# Patient Record
Sex: Female | Born: 1970
Health system: Southern US, Community
[De-identification: ages and names within clinical notes are randomized; demographics above are authoritative.]

## PROBLEM LIST (undated history)

## (undated) DIAGNOSIS — E282 Polycystic ovarian syndrome: Secondary | ICD-10-CM

## (undated) DIAGNOSIS — G43909 Migraine, unspecified, not intractable, without status migrainosus: Secondary | ICD-10-CM

## (undated) DIAGNOSIS — N809 Endometriosis, unspecified: Secondary | ICD-10-CM

## (undated) DIAGNOSIS — E209 Hypoparathyroidism, unspecified: Secondary | ICD-10-CM

## (undated) DIAGNOSIS — G2571 Drug induced akathisia: Secondary | ICD-10-CM

## (undated) DIAGNOSIS — E079 Disorder of thyroid, unspecified: Secondary | ICD-10-CM

## (undated) DIAGNOSIS — N200 Calculus of kidney: Secondary | ICD-10-CM

## (undated) DIAGNOSIS — Z91040 Latex allergy status: Secondary | ICD-10-CM

## (undated) DIAGNOSIS — R42 Dizziness and giddiness: Secondary | ICD-10-CM

## (undated) DIAGNOSIS — C801 Malignant (primary) neoplasm, unspecified: Secondary | ICD-10-CM

## (undated) DIAGNOSIS — E039 Hypothyroidism, unspecified: Secondary | ICD-10-CM

## (undated) HISTORY — DX: Hypoparathyroidism, unspecified: E20.9

## (undated) HISTORY — PX: OTHER SURGICAL HISTORY: SHX169

## (undated) HISTORY — PX: THYROIDECTOMY: SHX17

## (undated) HISTORY — DX: Latex allergy status: Z91.040

## (undated) HISTORY — DX: Dizziness and giddiness: R42

## (undated) HISTORY — DX: Hypothyroidism, unspecified: E03.9

## (undated) HISTORY — PX: ABDOMINAL SURGERY: SHX537

## (undated) HISTORY — DX: Migraine, unspecified, not intractable, without status migrainosus: G43.909

## (undated) HISTORY — DX: Polycystic ovarian syndrome: E28.2

## (undated) HISTORY — DX: Calculus of kidney: N20.0

## (undated) HISTORY — DX: Drug induced akathisia: G25.71

## (undated) HISTORY — DX: Hypocalcemia: E83.51

---

## 1993-06-01 HISTORY — PX: OTHER SURGICAL HISTORY: SHX169

## 1997-09-11 ENCOUNTER — Emergency Department (HOSPITAL_COMMUNITY): Admission: EM | Admit: 1997-09-11 | Discharge: 1997-09-11 | Payer: Self-pay | Admitting: Emergency Medicine

## 1997-12-11 ENCOUNTER — Other Ambulatory Visit: Admission: RE | Admit: 1997-12-11 | Discharge: 1997-12-11 | Payer: Self-pay | Admitting: Gynecology

## 1998-07-24 ENCOUNTER — Emergency Department (HOSPITAL_COMMUNITY): Admission: EM | Admit: 1998-07-24 | Discharge: 1998-07-24 | Payer: Self-pay | Admitting: Emergency Medicine

## 1998-07-25 ENCOUNTER — Emergency Department (HOSPITAL_COMMUNITY): Admission: EM | Admit: 1998-07-25 | Discharge: 1998-07-25 | Payer: Self-pay | Admitting: Emergency Medicine

## 1999-02-06 ENCOUNTER — Other Ambulatory Visit: Admission: RE | Admit: 1999-02-06 | Discharge: 1999-02-06 | Payer: Self-pay | Admitting: Gynecology

## 1999-07-28 ENCOUNTER — Encounter: Payer: Self-pay | Admitting: Obstetrics and Gynecology

## 1999-07-28 ENCOUNTER — Ambulatory Visit (HOSPITAL_COMMUNITY): Admission: RE | Admit: 1999-07-28 | Discharge: 1999-07-28 | Payer: Self-pay | Admitting: Obstetrics and Gynecology

## 1999-08-25 ENCOUNTER — Encounter: Payer: Self-pay | Admitting: Obstetrics and Gynecology

## 1999-08-25 ENCOUNTER — Ambulatory Visit (HOSPITAL_COMMUNITY): Admission: RE | Admit: 1999-08-25 | Discharge: 1999-08-25 | Payer: Self-pay | Admitting: Obstetrics and Gynecology

## 1999-10-13 ENCOUNTER — Inpatient Hospital Stay (HOSPITAL_COMMUNITY): Admission: AD | Admit: 1999-10-13 | Discharge: 1999-10-13 | Payer: Self-pay | Admitting: Obstetrics and Gynecology

## 1999-11-23 ENCOUNTER — Emergency Department (HOSPITAL_COMMUNITY): Admission: EM | Admit: 1999-11-23 | Discharge: 1999-11-23 | Payer: Self-pay | Admitting: Emergency Medicine

## 1999-11-23 ENCOUNTER — Encounter: Payer: Self-pay | Admitting: Emergency Medicine

## 1999-12-02 ENCOUNTER — Inpatient Hospital Stay (HOSPITAL_COMMUNITY): Admission: AD | Admit: 1999-12-02 | Discharge: 1999-12-02 | Payer: Self-pay | Admitting: Obstetrics and Gynecology

## 1999-12-05 ENCOUNTER — Inpatient Hospital Stay (HOSPITAL_COMMUNITY): Admission: AD | Admit: 1999-12-05 | Discharge: 1999-12-08 | Payer: Self-pay | Admitting: Obstetrics and Gynecology

## 1999-12-09 ENCOUNTER — Encounter: Admission: RE | Admit: 1999-12-09 | Discharge: 2000-02-10 | Payer: Self-pay | Admitting: Obstetrics and Gynecology

## 2000-07-21 ENCOUNTER — Encounter: Admission: RE | Admit: 2000-07-21 | Discharge: 2000-08-26 | Payer: Self-pay | Admitting: Obstetrics and Gynecology

## 2001-03-09 ENCOUNTER — Inpatient Hospital Stay (HOSPITAL_COMMUNITY): Admission: AD | Admit: 2001-03-09 | Discharge: 2001-03-09 | Payer: Self-pay | Admitting: Obstetrics and Gynecology

## 2001-03-09 ENCOUNTER — Encounter: Payer: Self-pay | Admitting: Obstetrics and Gynecology

## 2001-03-26 ENCOUNTER — Ambulatory Visit (HOSPITAL_COMMUNITY): Admission: AD | Admit: 2001-03-26 | Discharge: 2001-03-26 | Payer: Self-pay | Admitting: Obstetrics and Gynecology

## 2001-03-26 ENCOUNTER — Encounter (INDEPENDENT_AMBULATORY_CARE_PROVIDER_SITE_OTHER): Payer: Self-pay | Admitting: Specialist

## 2001-03-26 ENCOUNTER — Encounter: Payer: Self-pay | Admitting: Obstetrics and Gynecology

## 2001-06-14 DIAGNOSIS — D229 Melanocytic nevi, unspecified: Secondary | ICD-10-CM

## 2001-06-14 HISTORY — DX: Melanocytic nevi, unspecified: D22.9

## 2001-10-20 ENCOUNTER — Other Ambulatory Visit: Admission: RE | Admit: 2001-10-20 | Discharge: 2001-10-20 | Payer: Self-pay | Admitting: Obstetrics and Gynecology

## 2002-01-03 ENCOUNTER — Ambulatory Visit (HOSPITAL_COMMUNITY): Admission: RE | Admit: 2002-01-03 | Discharge: 2002-01-03 | Payer: Self-pay | Admitting: Endocrinology

## 2002-01-03 ENCOUNTER — Encounter: Payer: Self-pay | Admitting: Endocrinology

## 2002-01-18 ENCOUNTER — Ambulatory Visit (HOSPITAL_COMMUNITY): Admission: RE | Admit: 2002-01-18 | Discharge: 2002-01-18 | Payer: Self-pay | Admitting: Endocrinology

## 2002-01-18 ENCOUNTER — Encounter: Payer: Self-pay | Admitting: Endocrinology

## 2002-01-18 ENCOUNTER — Encounter (INDEPENDENT_AMBULATORY_CARE_PROVIDER_SITE_OTHER): Payer: Self-pay | Admitting: *Deleted

## 2002-05-14 ENCOUNTER — Emergency Department (HOSPITAL_COMMUNITY): Admission: EM | Admit: 2002-05-14 | Discharge: 2002-05-15 | Payer: Self-pay | Admitting: Emergency Medicine

## 2002-06-15 ENCOUNTER — Ambulatory Visit (HOSPITAL_COMMUNITY): Admission: RE | Admit: 2002-06-15 | Discharge: 2002-06-15 | Payer: Self-pay | Admitting: Endocrinology

## 2002-06-15 ENCOUNTER — Encounter: Payer: Self-pay | Admitting: Endocrinology

## 2002-07-12 ENCOUNTER — Ambulatory Visit (HOSPITAL_COMMUNITY): Admission: RE | Admit: 2002-07-12 | Discharge: 2002-07-12 | Payer: Self-pay | Admitting: Gynecology

## 2002-08-27 ENCOUNTER — Encounter: Payer: Self-pay | Admitting: Obstetrics and Gynecology

## 2002-08-27 ENCOUNTER — Inpatient Hospital Stay (HOSPITAL_COMMUNITY): Admission: AD | Admit: 2002-08-27 | Discharge: 2002-08-27 | Payer: Self-pay | Admitting: Obstetrics and Gynecology

## 2002-08-30 ENCOUNTER — Other Ambulatory Visit: Admission: RE | Admit: 2002-08-30 | Discharge: 2002-08-30 | Payer: Self-pay | Admitting: Obstetrics and Gynecology

## 2002-10-20 ENCOUNTER — Ambulatory Visit (HOSPITAL_COMMUNITY): Admission: RE | Admit: 2002-10-20 | Discharge: 2002-10-20 | Payer: Self-pay | Admitting: Obstetrics and Gynecology

## 2002-10-20 ENCOUNTER — Encounter: Payer: Self-pay | Admitting: Obstetrics and Gynecology

## 2002-10-30 ENCOUNTER — Inpatient Hospital Stay (HOSPITAL_COMMUNITY): Admission: AD | Admit: 2002-10-30 | Discharge: 2002-10-30 | Payer: Self-pay | Admitting: Obstetrics and Gynecology

## 2002-11-18 ENCOUNTER — Inpatient Hospital Stay (HOSPITAL_COMMUNITY): Admission: AD | Admit: 2002-11-18 | Discharge: 2002-11-18 | Payer: Self-pay | Admitting: Obstetrics and Gynecology

## 2002-11-23 ENCOUNTER — Inpatient Hospital Stay (HOSPITAL_COMMUNITY): Admission: AD | Admit: 2002-11-23 | Discharge: 2002-11-23 | Payer: Self-pay | Admitting: Obstetrics and Gynecology

## 2003-01-30 ENCOUNTER — Inpatient Hospital Stay (HOSPITAL_COMMUNITY): Admission: AD | Admit: 2003-01-30 | Discharge: 2003-01-30 | Payer: Self-pay | Admitting: Obstetrics and Gynecology

## 2003-02-02 ENCOUNTER — Encounter: Admission: RE | Admit: 2003-02-02 | Discharge: 2003-02-02 | Payer: Self-pay | Admitting: Obstetrics and Gynecology

## 2003-02-06 ENCOUNTER — Encounter: Admission: RE | Admit: 2003-02-06 | Discharge: 2003-02-06 | Payer: Self-pay | Admitting: Obstetrics and Gynecology

## 2003-02-09 ENCOUNTER — Encounter: Payer: Self-pay | Admitting: Obstetrics and Gynecology

## 2003-02-09 ENCOUNTER — Encounter: Admission: RE | Admit: 2003-02-09 | Discharge: 2003-02-09 | Payer: Self-pay | Admitting: Obstetrics and Gynecology

## 2003-02-09 ENCOUNTER — Inpatient Hospital Stay (HOSPITAL_COMMUNITY): Admission: AD | Admit: 2003-02-09 | Discharge: 2003-02-09 | Payer: Self-pay | Admitting: Obstetrics and Gynecology

## 2003-02-13 ENCOUNTER — Encounter: Admission: RE | Admit: 2003-02-13 | Discharge: 2003-02-13 | Payer: Self-pay | Admitting: Obstetrics and Gynecology

## 2003-02-15 ENCOUNTER — Inpatient Hospital Stay (HOSPITAL_COMMUNITY): Admission: AD | Admit: 2003-02-15 | Discharge: 2003-02-15 | Payer: Self-pay | Admitting: Obstetrics and Gynecology

## 2003-02-20 ENCOUNTER — Encounter: Admission: RE | Admit: 2003-02-20 | Discharge: 2003-02-20 | Payer: Self-pay | Admitting: Obstetrics and Gynecology

## 2003-02-21 ENCOUNTER — Inpatient Hospital Stay (HOSPITAL_COMMUNITY): Admission: RE | Admit: 2003-02-21 | Discharge: 2003-02-21 | Payer: Self-pay | Admitting: Obstetrics and Gynecology

## 2003-02-21 ENCOUNTER — Encounter: Payer: Self-pay | Admitting: Obstetrics and Gynecology

## 2003-02-22 ENCOUNTER — Inpatient Hospital Stay (HOSPITAL_COMMUNITY): Admission: AD | Admit: 2003-02-22 | Discharge: 2003-02-24 | Payer: Self-pay | Admitting: Obstetrics and Gynecology

## 2003-02-25 ENCOUNTER — Encounter: Admission: RE | Admit: 2003-02-25 | Discharge: 2003-03-27 | Payer: Self-pay | Admitting: Obstetrics and Gynecology

## 2004-02-09 ENCOUNTER — Emergency Department (HOSPITAL_COMMUNITY): Admission: EM | Admit: 2004-02-09 | Discharge: 2004-02-09 | Payer: Self-pay | Admitting: Emergency Medicine

## 2004-04-21 ENCOUNTER — Other Ambulatory Visit: Admission: RE | Admit: 2004-04-21 | Discharge: 2004-04-21 | Payer: Self-pay | Admitting: Gynecology

## 2006-03-22 ENCOUNTER — Other Ambulatory Visit: Admission: RE | Admit: 2006-03-22 | Discharge: 2006-03-22 | Payer: Self-pay | Admitting: Gynecology

## 2006-06-01 HISTORY — PX: THYROIDECTOMY: SHX17

## 2006-06-01 HISTORY — PX: ABDOMINAL SURGERY: SHX537

## 2006-07-14 ENCOUNTER — Encounter: Admission: RE | Admit: 2006-07-14 | Discharge: 2006-07-14 | Payer: Self-pay | Admitting: Endocrinology

## 2006-07-28 ENCOUNTER — Other Ambulatory Visit: Admission: RE | Admit: 2006-07-28 | Discharge: 2006-07-28 | Payer: Self-pay | Admitting: Interventional Radiology

## 2006-07-28 ENCOUNTER — Encounter (INDEPENDENT_AMBULATORY_CARE_PROVIDER_SITE_OTHER): Payer: Self-pay | Admitting: Specialist

## 2006-07-28 ENCOUNTER — Encounter: Admission: RE | Admit: 2006-07-28 | Discharge: 2006-07-28 | Payer: Self-pay | Admitting: Endocrinology

## 2006-08-24 ENCOUNTER — Emergency Department (HOSPITAL_COMMUNITY): Admission: EM | Admit: 2006-08-24 | Discharge: 2006-08-25 | Payer: Self-pay | Admitting: Emergency Medicine

## 2006-09-07 ENCOUNTER — Ambulatory Visit (HOSPITAL_COMMUNITY): Admission: RE | Admit: 2006-09-07 | Discharge: 2006-09-08 | Payer: Self-pay | Admitting: Surgery

## 2006-09-08 ENCOUNTER — Encounter (INDEPENDENT_AMBULATORY_CARE_PROVIDER_SITE_OTHER): Payer: Self-pay | Admitting: *Deleted

## 2006-09-28 ENCOUNTER — Encounter: Admission: RE | Admit: 2006-09-28 | Discharge: 2006-09-28 | Payer: Self-pay | Admitting: Endocrinology

## 2006-10-06 ENCOUNTER — Encounter: Admission: RE | Admit: 2006-10-06 | Discharge: 2006-10-06 | Payer: Self-pay | Admitting: Endocrinology

## 2006-10-13 ENCOUNTER — Encounter: Admission: RE | Admit: 2006-10-13 | Discharge: 2006-10-13 | Payer: Self-pay | Admitting: Endocrinology

## 2007-02-20 ENCOUNTER — Emergency Department (HOSPITAL_COMMUNITY): Admission: EM | Admit: 2007-02-20 | Discharge: 2007-02-21 | Payer: Self-pay | Admitting: Emergency Medicine

## 2007-11-07 ENCOUNTER — Encounter: Admission: RE | Admit: 2007-11-07 | Discharge: 2007-11-07 | Payer: Self-pay | Admitting: Endocrinology

## 2007-11-08 ENCOUNTER — Encounter: Admission: RE | Admit: 2007-11-08 | Discharge: 2007-11-08 | Payer: Self-pay | Admitting: Endocrinology

## 2007-11-09 ENCOUNTER — Encounter: Admission: RE | Admit: 2007-11-09 | Discharge: 2007-11-09 | Payer: Self-pay | Admitting: Endocrinology

## 2008-12-05 ENCOUNTER — Encounter: Admission: RE | Admit: 2008-12-05 | Discharge: 2008-12-05 | Payer: Self-pay | Admitting: Gynecology

## 2008-12-17 ENCOUNTER — Encounter (HOSPITAL_COMMUNITY): Admission: RE | Admit: 2008-12-17 | Discharge: 2009-03-17 | Payer: Self-pay | Admitting: Endocrinology

## 2009-11-27 ENCOUNTER — Encounter: Admission: RE | Admit: 2009-11-27 | Discharge: 2009-11-27 | Payer: Self-pay | Admitting: Internal Medicine

## 2010-06-22 ENCOUNTER — Encounter: Payer: Self-pay | Admitting: Internal Medicine

## 2010-06-23 ENCOUNTER — Encounter: Payer: Self-pay | Admitting: Endocrinology

## 2010-06-27 ENCOUNTER — Encounter
Admission: RE | Admit: 2010-06-27 | Discharge: 2010-06-27 | Payer: Self-pay | Source: Home / Self Care | Attending: Cardiology | Admitting: Cardiology

## 2010-07-09 HISTORY — PX: NM MYOCAR PERF WALL MOTION: HXRAD629

## 2010-10-17 NOTE — Op Note (Signed)
[name], [name]            ACCOUNT NO.:  [id number]   MEDICAL RECORD NO.:  [id number]          PATIENT TYPE:  OIB   LOCATION:  0098                         FACILITY:  [facility]   PHYSICIAN:  Velora Heckler, MD      DATE OF BIRTH:  [birth year]   DATE OF PROCEDURE:  09/07/2006  DATE OF DISCHARGE:                               OPERATIVE REPORT   PREOPERATIVE DIAGNOSIS:  Papillary thyroid carcinoma.   POSTOPERATIVE DIAGNOSIS:  Papillary thyroid carcinoma.   PROCEDURE:  1. Total thyroidectomy.  2. Central compartment (Zone 6) lymph node dissection.   SURGEON:  Velora Heckler, MD, FACS   ASSISTANT:  Leonie Man, M.D.   ANESTHESIA:  General.   ESTIMATED BLOOD LOSS:  Minimal.   PREPARATION:  Betadine.   COMPLICATIONS:  None.   INDICATIONS:  The patient is a 40 year old white female from [place],  [place].  The patient presented in early March 2008 with newly  diagnosed papillary thyroid carcinoma.  The patient had been followed  for four years with Hashimoto's thyroiditis.  She had a right-sided  thyroid nodule, which increased in size from 2.0 cm to 3.2 cm.  Biopsy  in late February was performed with ultrasound guidance.  This  demonstrated evidence of papillary thyroid carcinoma.  The patient was  referred to general surgery; and now comes to the operating room for  resection.   BODY OF REPORT:  Procedures done in OR #11 at the [facility].  The patient was brought in a supine position on the operating room  table.  Following administration of general anesthesia, the patient was  positioned and then prepped and draped in the usual strict aseptic  fashion.  After ascertaining that an adequate level of anesthesia had  been obtained, a Kocher incision was made with a #15 blade.  Dissection  was carried down through subcutaneous tissues and platysma.  Hemostasis  was obtained with the electrocautery.  External jugular vein was ligated  in continuity with  2-0 silk ties and divided.  Skin flaps were elevated  cephalad and caudad from the thyroid notch to the sternal notch.  A  Mahorner's self retaining retractor was placed for exposure.  Strap  muscles were incised in the midline.  Dissection was carried down to the  thyroid gland.  The left thyroid lobe was exposed.  On palpation, it was  mildly enlarged without dominant mass.  There was no significant  lymphadenopathy on the left.   Next, we turned our attention to the right thyroid lobe.  Again, strap  muscles were reflected laterally and the right lobe exposed the venous  tributaries are divided between small Ligaclips with the harmonic  scalpel.  The gland was gently dissected out with a Barista.  The superior pole was exposed.  The superior pole vessels are dissected  out, ligated in continuity with 2-0 silk ties, and divided with the  harmonic scalpel.  This section was carried around the superior pole  providing for complete mobilization.  Parathyroid tissue was identified  and preserved.  Branches of the inferior thyroid artery are divided  between small Ligaclips.  Inferior venous tributaries are ligated with 3-  0 silk tie; and then divided with the harmonic scalpel.  Gland was  rolled anteriorly.  There was a moderately large approximately 3 cm  centrally located mass within the right thyroid lobe.  This was somewhat  approximate to the trachea.  It does not appear to violate the capsule  of the thyroid.  He does not appear to be locally invasive into  surrounding structures.  Gland was rolled further anteriorly.  Ligament  of Allyson Sabal was identified and transected.  Care was taken to avoid injury  to the parathyroid tissue or the recurrent nerve.  Gland was mobilized  off the anterior surface of the trachea across the midline.  There was  no significant pyramidal lobe identified.  Good hemostasis was obtained  in the right neck.  A dry pack was placed in the right  neck.   Next, we turned our attention back to the left thyroid lobe.  Again, the  lobe was exposed and gently dissected out with the [employer].  The middle thyroid vein was divided between Ligaclips with the harmonic  scalpel.  The gland was further dissected out with the  Kitner  dissector; superior pole was exposed.  Superior pole vessels are ligated  in continuity with 2-0 silk ties; and divided with the harmonics  scalpel.  Posterior branch at the superior pole was divided between  Ligaclips with the harmonic scalpel.  The inferior venous tributaries  are divided between Ligaclips with the harmonic scalpel.  The gland was  rolled anteriorly.  The parathyroid tissue was identified and preserved.  Recurrent nerve was identified and preserved.  Branches of the inferior  thyroid artery are divided between small Ligaclips.  The ligament of  Allyson Sabal was transected with the electrocautery and the gland was rolled  up, and onto the anterior trachea from which it was completely excised  with the electrocautery.  Sutures were used to mark the right superior  pole.  The entire thyroid was submitted as the specimen, for permanent  sections.   Next, we turned our attention to the central compartment.  The area  anterior to the trachea was palpated.  The lymph node bearing tissue was  dissected out beginning in the left side of the trachea just above the  level of the recurrent nerve, which was identified and preserved.  Multiple lymph nodes are included with the specimen.  Dissection was  carried down to the innominate vessels.  Dissection was carried across  the midline.  On the right side, the superior horn of the thymus was  identified.  The superior pole of the thymus was taken with the  specimen.  Associated lymph nodes on the right side of the trachea are  also included.  Hemostasis was obtained throughout with the electrocautery.  The right recurrent nerve was identified and  preserved.  The tissue was excised from zone 6, the central compartment, and placed  into a specimen cup and submitted separately to pathology for review.   The neck was irrigated copiously with warm saline.  This was evacuated.  Good hemostasis was noted.  Surgicel was placed throughout the operative  field.  Strap muscles were reapproximated to the midline with  interrupted 3-0 Vicryl.  Platysma was closed with interrupted 3-0 Vicryl  sutures.  The skin was closed with a running 4-0 Prolene subcuticular  suture.  Would was washed and dried and benzoin and Steri-Strips are  applied.  Sterile dressings are applied.  The patient was awakened from  anesthesia, and brought to the recovery room in stable condition.  The  patient tolerated the procedure well.      Velora Heckler, MD  Electronically Signed    TMG/MEDQ  D:  09/07/2006  T:  09/07/2006  Job:  [id number]   cc:   Dorisann Frames, M.D.  Fax: [phone]   [facility] Dr. Bess Kinds

## 2010-10-17 NOTE — Op Note (Signed)
[facility]  Patient:    [name], PORE Visit Number: [id number] MRN: [id number]          Service Type: DSU Location: [facility] Attending Physician:  Oliver Pila Dictated by:   Alvino Chapel, M.D. Proc. Date: 03/26/01 Admit Date:  03/26/2001 Discharge Date: 03/26/2001                             Operative Report  PREOPERATIVE DIAGNOSIS:       Missed abortion at 8 weeks and 4 days.  POSTOPERATIVE DIAGNOSIS:      Missed abortion at 8 weeks and 4 days.  OPERATION:                    Suction D&C.  SURGEON:                      Alvino Chapel, M.D.  ANESTHESIA:                   LMAC and local.  FINDINGS:                     Moderate amount of products of conception obtained.  The uterus was approximately eight weeks size.  DESCRIPTION OF PROCEDURE:     The patient was taken to the operating room where [facility] anesthesia was obtained without difficulty.  She was then prepped and draped in a normal sterile fashion in the dorsolithotomy position.  A speculum was placed within the vagina and the cervix was grasped with a single tooth tenaculum and local 0.25% lidocaine instilled circumferentially into the cervix.  The uterus was then sounded and the cervix found to be dilated somewhat.  The uterus was very easy to dilate given this preoperative dilation and was gently taken to 25 Pratt dilator.  An 8 mm suction curettage was then introduced into the uterus and in several passes, a moderate amount of products of conception obtained, and sharp curettage was also performed with no significant tissue palpable in all four quadrants, and two additional passes produced no significant tissue.  Therefore, all instruments and sponges were removed from the vagina, and the patient was taken to the recovery room in stable condition. Dictated by:   Alvino Chapel, M.D. Attending Physician:  Oliver Pila DD:  03/26/01 TD:   03/28/01 Job: [id number]

## 2010-10-17 NOTE — H&P (Signed)
[facility]  Patient:    [name], [name]                   MRN: [id number] Adm. Date:  [id number] Disc. Date: [id number] Attending:  Malon Kindle                         History and Physical  CHIEF COMPLAINT:              Cesarean section.  HISTORY OF PRESENT ILLNESS:   This is a 40 year old white female, gravida 1, para 0, with an EGA of 36+ weeks by an LMP consistent with the second-trimester ultrasound with an [facility] of July 28 who presents for primary elective cesarean section due to breech presentation and cholestasis of pregnancy.  Prenatal care was complicated by an upper respiratory tract infection at approximately 20 weeks treated with Zithromax, palpitations and shortness of breath at approximately 31 weeks evaluated by Dr. Tenny Craw of [facility].  She had an echocardiogram which was essentially normal with trace mitral and tricuspid regurgitation and her evaluation was otherwise normal. She has had a persistent breech presentation and has been offered external cephalic version, but prefers a cesarean section.  She also has complained of significant itching since approximately 34 weeks.  She has been treated with Benadryl and Questran and has had elevated bile acids consistent with a cholestasis of pregnancy.  Last bile acids performed on June 25 were elevated at 12.6.  She has had one non-stress test due to the cholestasis of pregnancy.  Due to the fact that there is an increased risk of still birth with cholestasis of pregnancy, and she is going to have a cesarean section for breech anyway, we have elected to proceed with her cesarean section one day before she will be [redacted] weeks pregnant.  She has been advised that there is a small risk of fetal lung immaturity and advised of the risks of cesarean section and wishes to proceed.  PRENATAL LABORATORY DATA:     Blood type is O+ with a negative antibody screen, RPR nonreactive,  rubella immune, hepatitis B surface antigen negative, HIV negative, gonorrhea and Chlamydia negative, triple screen is normal, one-hour GCT is 99, group B strep is negative.  PAST MEDICAL HISTORY:         Significant for migraine headaches and a history of superficial thrombophlebitis.  PAST SURGICAL HISTORY:        Significant for laparoscopy in 1994 for endometriosis and she has also had her wisdom teeth removed.  ALLERGIES:                    COMPAZINE.  CURRENT MEDICATIONS:          Questran and Benadryl.  SOCIAL HISTORY:               The patient is married and denies alcohol, tobacco or drug use.  FAMILY HISTORY:               Noncontributory.  REVIEW OF SYSTEMS:            Otherwise negative.  PHYSICAL EXAMINATION:  VITAL SIGNS:                  Weight 178 pounds, blood pressure 112/70.  GENERAL:                      She is a well-developed, well-nourished,  gravid female in moderate distress from her itching.  HEENT:                        Pupils equal, round, reactive to light and accommodation.  Extraocular muscles are intact.  Oropharynx is clear without erythema or exudate.  NECK:                         Supple without lymphadenopathy or thyromegaly.  LUNGS:                        Clear to auscultation.  HEART:                        Regular rate and rhythm with a 2/6 systolic ejection murmur consistent with pregnancy.  BACK:                         No CVA tenderness.  ABDOMEN:                      Gravid, nontender, fundal height 37 cm, palpable breech presentation.  EXTREMITIES:                  Trace edema, nontender, deep tendon reflexes are 2/4 and symmetric.  VAGINAL:                      Deferred.  ASSESSMENT:                   Intrauterine pregnancy at 36+ weeks with a breech presentation and cholestasis of pregnancy.  Due to the small risk of still birth and the fact that patient desires a cesarean section for her breech presentation, we are  going to proceed with cesarean section one day prior to her being [redacted] weeks pregnant.  Again, the patient understands the small risk of fetal lung immaturity and the risks of cesarean section.  PLAN:                         Admit the patient for primary cesarean section. DD:  12/04/99 TD:  12/04/99 Job: [id number]

## 2010-10-17 NOTE — Discharge Summary (Signed)
[facility]  Patient:    [name], [name]                   MRN: [id number] Adm. Date:  [id number] Disc. Date: 12/08/99 Attending:  Oliver Pila                           Discharge Summary  ADMITTING DIAGNOSES:          1. Intrauterine pregnancy at 36+ weeks.                               2. Breech presentation.                               3. Cholestasis pregnancy.  DISCHARGE DIAGNOSES:          1. Intrauterine pregnancy at 36+ weeks.                               2. Breech presentation.                               3. Cholestasis pregnancy.  PROCEDURE:                   Primary low transverse cesarean section without extension.  COMPLICATIONS:                None.  CONSULTATIONS:                None.  HISTORY AND PHYSICAL:         Briefly, this is a 40 year old white female, gravida 1, para 0 with an EGA of 36+ weeks with a persistent breech presentation and cholestasis pregnancy with significant itching.  Due to her significant itching and breech presentation and the fact that the patient declined external cephalic version, she is being admitted for primary cesarean section.  Prenatal care was otherwise essentially uncomplicated.  PAST MEDICAL HISTORY:         Significant for migraine headaches.  History of superficial thrombophlebitis.  History of laparoscopy in 1994 for endometriosis.  Wisdom tooth removal.  ALLERGIES:                    COMPAZINE.  MEDICATIONS:                  Questran and Benadryl.  PHYSICAL EXAMINATION:  ABDOMEN:                      Benign, gravida and nontender with fundal height of 37 cm.  Well healed laparoscopic incisions.  Palpable breech presentation.  HOSPITAL COURSE:              The patient was admitted on the day of surgery and ultrasound did confirm breech presentation.  She then underwent a primary low transverse cesarean section under spinal anesthesia with an estimated blood loss of 800  cc.  She had normal anatomy and delivered a viable female infant with Apgars of 7 and 9 and weighed 5 pounds 4 ounces.  Postoperatively, she did very well and was rapidly able to ambulate, tolerate a regular diet and breast feed her baby without complications.  Preoperative  hemoglobin was 13.4, postoperative hemoglobin was 12.5.  She was given a Dulcolax suppository on postoperative day #1 to help increase her bowel function, to move through the bile acid and help with her itching.  Her itching did pretty much resolve by postoperative day #3.  At this time, her incision was healing well and her Prolene subcuticular suture was removed and Steri-Strips left in place.  She was felt to be stable enough for discharge at this time.  CONDITION ON DISCHARGE:       Stable.  DISPOSITION:                  Discharged to home.  DISCHARGE INSTRUCTIONS:       1. Regular diet.                               2. Activity - Pelvic rest.  No strenuous                                  activity.  No driving.                               3. Follow up in ten days for an incision check.                               4. She was given our discharge pamphlet.  DISCHARGE MEDICATIONS:        Percocet p.r.n. pain. DD:  12/08/99 TD:  12/08/99 Job: [id number]

## 2010-10-17 NOTE — Op Note (Signed)
[facility]  Patient:    [name], [name]                   MRN: [id number] Proc. Date: 12/05/99 Adm. Date:  [id number] Attending:  Oliver Pila                           Operative Report  PREOPERATIVE DIAGNOSIS:       Intrauterine pregnancy at 36 weeks, breech                               presentation, cholestasis of pregnancy.  POSTOPERATIVE DIAGNOSIS:      Intrauterine pregnancy at 36 weeks, breech                               presentation, cholestasis of pregnancy.  OPERATION:                    Primary low transverse cesarean section without                               extension.  SURGEON:                      Zenaida Niece, M.D.  ASSISTANT:                    Malachi Pro. Ambrose Mantle, M.D.  ANESTHESIA:                   Spinal.  ESTIMATED BLOOD LOSS:         800 cc.  PROPHYLAXIS:                  Ancef 1 g after cord clamp.  OPERATIVE FINDINGS:  A normal female anatomy and delivery of a viable female infant with Apgars of 7 and 9, weight 5 pounds, 4 ounces. Count was correct. Condition stable.  DESCRIPTION OF PROCEDURE:  After appropriate informed consent was obtained, the patient was taken to the operating room and then placed in the sitting position. Ellison Hughs., M.D. instilled spinal anesthesia, and she was placed in the dorsal supine position with a left lateral tilt. Her abdomen was prepped and draped in the usual sterile fashion and a Foley catheter inserted. When the level of her anesthesia was found to be adequate, her abdomen was entered via a standard Pfannenstiel incision. The bladder flap was created sharply and a 4 cm transverse incision was made in the lower uterine segment. This was extended bilaterally digitally and for a short distance with bandage scissors. Clear amniotic fluid was noted. The fetal feet were at the incision and were delivered. The remainder of the baby delivered easily to  the scapula. The arms were delivered, followed by the vertex delivered without any difficulty. The mouth and nares were suctioned. The cord was doubly clamped and cut and the infant handed to the awaiting pediatric team. The cord blood and a segment of cord for gas were obtained and the placenta was delivered spontaneously. The uterus was wiped with a clean lap pad and found to be normal. The cervix was dilated with a long ring forceps. The uterine incision was inspected and found to be free of extensions. It  was then closed in one layer, being a running locking layer with #1 chromic. Bleeding from the serosal edges was controlled with electrocautery. Both tubes and ovaries were inspected and found to be normal. Her uterine incision was again inspected and found to be hemostatic. The subfascial space was then irrigated and made hemostatic with electrocautery. The fascia was closed in a running fashion, starting at both ends and meeting in the middle with 0 Vicryl. The subcutaneous tissue was then irrigated and made hemostatic with electrocautery. The skin was then closed with a running subcuticular suture of 4-0 Prolene followed by Steri-Strips and a sterile bandage. The patient tolerated the procedure well and was taken to the recovery room in stable condition. DD:  12/05/99 TD:  12/05/99 Job: [id number]

## 2010-10-17 NOTE — Discharge Summary (Signed)
NAME:  [name], [name]                      ACCOUNT NO.:  [id number]   MEDICAL RECORD NO.:  [id number]                   PATIENT TYPE:  INP   LOCATION:  9130                                 FACILITY:  [facility]   PHYSICIAN:  Zenaida Niece, M.D.             DATE OF BIRTH:  05/16/1971   DATE OF ADMISSION:  02/22/2003  DATE OF DISCHARGE:                                 DISCHARGE SUMMARY   ADMISSION DIAGNOSES:  1. Intrauterine pregnancy at 36 weeks.  2. Cholestasis of pregnancy.  3. Nephrolithiasis.  4. Prior cesarean section.   DISCHARGE DIAGNOSES:  1. Intrauterine pregnancy at 36 weeks.  2. Cholestasis of pregnancy.  3. Nephrolithiasis.  4. Prior cesarean section.   PROCEDURES:  On February 22, 2003 she had a VBAC.   HISTORY AND PHYSICAL:  This is a 40 year old white female gravida 4 para 1-0-  2-1 with an EGA of [redacted] weeks who prevents for induction with cholestasis of  pregnancy and a mature fetal lung study obtained by an amniocentesis  performed by Dr. Senaida Ores on February 21, 2003.  Prenatal care  complicated by cholestasis of pregnancy and multiple episodes of  nephrolithiasis, a history of Hashimoto's disease with a stable thyroid  throughout the pregnancy. She has a history of a prior cesarean section for  a breech presentation and desires a VBAC.  A history of postpartum  depression and she was restarted on Zoloft at 33 weeks.   PRENATAL LABORATORY DATA:  Blood type O positive with a negative antibody  screen.  RPR nonreactive.  Rubella immune.  Hepatitis B surface antigen  negative.  Gonorrhea and chlamydia negative.  Cystic fibrosis is normal.  Group B strep is negative.  Triple screen normal.  One-hour Glucola 116.   PAST OBSTETRICAL HISTORY:  1. In 2001 a low transverse cesarean section at term, 5 pounds 4 ounces,     complicated by breech and cholestasis of pregnancy.  2. In 2002 and 2003 she had spontaneous abortions.   MEDICAL HISTORY:  1. Hashimoto's  thyroiditis.  2. Postpartum depression/anxiety.  3. Superficial thrombophlebitis.  4. History of migraines.  5. Nephrolithiasis.   PAST SURGICAL HISTORY:  Cesarean section, laparoscopy, and wisdom teeth.   PHYSICAL EXAMINATION:  VITAL SIGNS:  She is afebrile with stable vital  signs.  Fetal heart tracing reactive without contractions.  ABDOMEN:  Soft and nontender.  PELVIC:  Vaginal exam is 1+, 50, and -2, and amniotomy reveals clear fluid.   HOSPITAL COURSE:  The patient was admitted, started on Pitocin, and had  amniotomy performed for induction.  She progressed into active labor,  received an epidural.  On the evening of September 23 she progressed to  complete, pushed for approximately one hour, and had a VBAC of a vigorous  female infant over a small first degree laceration.  Apgars were 8 and 9.  The  baby weighed 6 pounds 10 ounces.  The placenta delivered  spontaneous and was  intact.  First degree laceration repaired with 2-0 Vicryl and a right labial  laceration was not repaired.  Estimated blood loss 450 mL.  Postpartum she  had no complications and breast fed her baby without problems.  Predelivery  hemoglobin 12.9; post delivery 11.1.  On the morning of postpartum day #2  she was stable for discharge home.   DISCHARGE INSTRUCTIONS:  1. Regular diet.  2. Pelvic rest.  3. Follow up in six weeks.  4. Medications:  Over-the-counter Tylenol or ibuprofen p.r.n.  5. She was given our discharge pamphlet.                                               Zenaida Niece, M.D.    TDM/MEDQ  D:  02/24/2003  T:  02/24/2003  Job:  [id number]

## 2011-03-12 LAB — BASIC METABOLIC PANEL
BUN: 8
CO2: 29
Calcium: 8.8
Chloride: 104
Creatinine, Ser: 0.7
GFR calc Af Amer: >60
GFR calc non Af Amer: >60

## 2011-03-12 LAB — URINALYSIS, ROUTINE W REFLEX MICROSCOPIC
Protein, ur: NEGATIVE
Specific Gravity, Urine: 1.016
Urobilinogen, UA: 0.2
pH: 6

## 2011-03-12 LAB — CBC
MCHC: 33
MCV: 83.9
Platelets: 282
RBC: 4.95

## 2011-03-12 LAB — URINE MICROSCOPIC-ADD ON

## 2011-03-12 LAB — DIFFERENTIAL
Basophils Relative: 1
Eosinophils Absolute: 0.1
Monocytes Relative: 7
Neutrophils Relative %: 69

## 2011-04-27 ENCOUNTER — Other Ambulatory Visit: Payer: Self-pay | Admitting: Gynecology

## 2011-04-27 DIAGNOSIS — Z1231 Encounter for screening mammogram for malignant neoplasm of breast: Secondary | ICD-10-CM

## 2011-06-04 ENCOUNTER — Ambulatory Visit
Admission: RE | Admit: 2011-06-04 | Discharge: 2011-06-04 | Disposition: A | Discharge status: Home / Self Care | Payer: Self-pay | Source: Ambulatory Visit | Attending: Gynecology | Admitting: Gynecology

## 2011-06-04 DIAGNOSIS — Z1231 Encounter for screening mammogram for malignant neoplasm of breast: Secondary | ICD-10-CM

## 2011-06-22 ENCOUNTER — Other Ambulatory Visit (HOSPITAL_COMMUNITY): Payer: Self-pay | Admitting: Endocrinology

## 2011-06-22 ENCOUNTER — Encounter (HOSPITAL_COMMUNITY)
Admission: RE | Admit: 2011-06-22 | Discharge: 2011-06-22 | Disposition: A | Discharge status: Home / Self Care | Payer: BC Managed Care – PPO | Source: Ambulatory Visit | Attending: Endocrinology | Admitting: Endocrinology

## 2011-06-22 DIAGNOSIS — C73 Malignant neoplasm of thyroid gland: Secondary | ICD-10-CM | POA: Insufficient documentation

## 2011-06-22 MED ORDER — THYROTROPIN ALFA 1.1 MG IM SOLR: 0.9000 mg | INTRAMUSCULAR | Status: DC

## 2011-06-22 MED ORDER — THYROTROPIN ALFA 1.1 MG IM SOLR
0.9000 mg | INTRAMUSCULAR | Status: AC
  Administered 2011-06-22: 0.9 mg via INTRAMUSCULAR
  Filled 2011-06-22: qty 0.9

## 2011-06-22 NOTE — Progress Notes (Signed)
Has had procedure before, reviewed with patient again and teaching sheets given.

## 2011-06-23 ENCOUNTER — Ambulatory Visit (HOSPITAL_COMMUNITY)
Admission: RE | Admit: 2011-06-23 | Discharge: 2011-06-23 | Disposition: A | Discharge status: Home / Self Care | Payer: BC Managed Care – PPO | Source: Ambulatory Visit | Attending: Endocrinology | Admitting: Endocrinology

## 2011-06-23 MED ORDER — THYROTROPIN ALFA 1.1 MG IM SOLR
0.9000 mg | INTRAMUSCULAR | Status: AC
  Administered 2011-06-23: 0.9 mg via INTRAMUSCULAR
  Filled 2011-06-23: qty 0.9

## 2011-06-24 ENCOUNTER — Ambulatory Visit (HOSPITAL_COMMUNITY): Payer: BC Managed Care – PPO

## 2011-06-24 LAB — HCG, SERUM, QUALITATIVE: Preg, Serum: NEGATIVE

## 2011-06-25 MED FILL — Thyrotropin Alfa For Inj 1.1 MG: INTRAMUSCULAR | Qty: 0.9 | Status: AC

## 2011-06-26 ENCOUNTER — Encounter (HOSPITAL_COMMUNITY)
Admission: RE | Admit: 2011-06-26 | Discharge: 2011-06-26 | Disposition: A | Discharge status: Home / Self Care | Payer: BC Managed Care – PPO | Source: Ambulatory Visit | Attending: Endocrinology | Admitting: Endocrinology

## 2011-06-26 DIAGNOSIS — Z09 Encounter for follow-up examination after completed treatment for conditions other than malignant neoplasm: Secondary | ICD-10-CM | POA: Insufficient documentation

## 2011-06-26 DIAGNOSIS — C73 Malignant neoplasm of thyroid gland: Secondary | ICD-10-CM | POA: Insufficient documentation

## 2011-06-26 MED ORDER — SODIUM IODIDE I 131 CAPSULE
4.0000 | Freq: Once | INTRAVENOUS | Status: AC | PRN
  Administered 2011-06-26: 4 via ORAL

## 2011-12-01 ENCOUNTER — Other Ambulatory Visit: Payer: Self-pay | Admitting: Dermatology

## 2012-01-21 ENCOUNTER — Other Ambulatory Visit: Payer: Self-pay | Admitting: Dermatology

## 2012-04-13 ENCOUNTER — Other Ambulatory Visit: Payer: Self-pay | Admitting: Dermatology

## 2012-04-27 ENCOUNTER — Other Ambulatory Visit: Payer: Self-pay | Admitting: Gynecology

## 2012-04-27 DIAGNOSIS — Z1231 Encounter for screening mammogram for malignant neoplasm of breast: Secondary | ICD-10-CM

## 2012-06-04 ENCOUNTER — Emergency Department (HOSPITAL_COMMUNITY)
Admission: EM | Admit: 2012-06-04 | Discharge: 2012-06-04 | Disposition: A | Discharge status: Home / Self Care | Payer: BC Managed Care – PPO | Attending: Emergency Medicine | Admitting: Emergency Medicine

## 2012-06-04 ENCOUNTER — Encounter (HOSPITAL_COMMUNITY): Payer: Self-pay | Admitting: Emergency Medicine

## 2012-06-04 DIAGNOSIS — N809 Endometriosis, unspecified: Secondary | ICD-10-CM | POA: Insufficient documentation

## 2012-06-04 DIAGNOSIS — E039 Hypothyroidism, unspecified: Secondary | ICD-10-CM | POA: Insufficient documentation

## 2012-06-04 DIAGNOSIS — M542 Cervicalgia: Secondary | ICD-10-CM | POA: Insufficient documentation

## 2012-06-04 DIAGNOSIS — N200 Calculus of kidney: Secondary | ICD-10-CM | POA: Insufficient documentation

## 2012-06-04 DIAGNOSIS — R209 Unspecified disturbances of skin sensation: Secondary | ICD-10-CM | POA: Insufficient documentation

## 2012-06-04 HISTORY — DX: Disorder of thyroid, unspecified: E07.9

## 2012-06-04 HISTORY — DX: Endometriosis, unspecified: N80.9

## 2012-06-04 LAB — CBC WITH DIFFERENTIAL/PLATELET
Basophils Relative: 1 % (ref 0–1)
Eosinophils Absolute: 0.1 10*3/uL (ref 0.0–0.7)
Eosinophils Relative: 1 % (ref 0–5)
HCT: 41.6 % (ref 36.0–46.0)
Hemoglobin: 14.3 g/dL (ref 12.0–15.0)
MCH: 28.9 pg (ref 26.0–34.0)
MCHC: 34.4 g/dL (ref 30.0–36.0)
MCV: 84.2 fL (ref 78.0–100.0)
Monocytes Absolute: 0.6 10*3/uL (ref 0.1–1.0)
Monocytes Relative: 6 % (ref 3–12)
Neutrophils Relative %: 68 % (ref 43–77)

## 2012-06-04 LAB — POCT I-STAT, CHEM 8
Calcium, Ion: 1.12 mmol/L (ref 1.12–1.23)
Glucose, Bld: 96 mg/dL (ref 70–99)
HCT: 43 % (ref 36.0–46.0)
Hemoglobin: 14.6 g/dL (ref 12.0–15.0)
TCO2: 25 mmol/L (ref 0–100)

## 2012-06-04 NOTE — ED Notes (Signed)
Pt c/o muscle cramping  In upper back x 3 weeks, last 2 days, cramping down bilat arms with numbness in fingertips. Pt has hypothyroidism, pt recently switched from Calcium Carbonate to Calcium Citrate by MD at [facility]

## 2012-06-04 NOTE — ED Notes (Signed)
[facility] labs drawn and waiting in lab for orders

## 2012-06-04 NOTE — ED Provider Notes (Signed)
History     CSN: [id number]  Arrival date & time 06/04/12  0019   First MD Initiated Contact with Patient 06/04/12 0309      Chief Complaint  Patient presents with  . Muscle Pain    (Consider location/radiation/quality/duration/timing/severity/associated sxs/prior treatment) HPI Comments: Patient with Hx Hypoparathyroidism was recently switch from Cal cium Carbonate to Calcium Citrate in the hopes of decreasing the number of Kidney stones but for the past 2-3 weeks she has had muscle cramping.  She is unsure if this from  A low calcium level or from the "pulled muscle" in the L scapula region that is getting worse and radiating up her neck and now causing  numbness and tingling into her bilateral arms  She has been treating this condition with acupuncture and topical ointments as she has many adverse reactions to numerous medications  She contacted her PCP at [facility] who instructed her to come to the ED from a Calcium level check   Patient is a 42 y.o. female presenting with musculoskeletal pain. The history is provided by the patient.  Muscle Pain This is a recurrent problem. The current episode started 1 to 4 weeks ago. The problem occurs constantly. The problem has been unchanged. Associated symptoms include numbness. Pertinent negatives include no chest pain, chills, fever, headaches, nausea, neck pain or weakness.    Past Medical History  Diagnosis Date  . Thyroid disease   . Endometriosis     Past Surgical History  Procedure Date  . Cesarean section   . Abdominal surgery   . Thyroidectomy     No family history on file.  History  Substance Use Topics  . Smoking status: Never Smoker   . Smokeless tobacco: Not on file  . Alcohol Use: Yes     Comment: occasional    OB History    Grav Para Term Preterm Abortions TAB SAB Ect Mult Living                  Review of Systems  Constitutional: Negative for fever and chills.  HENT: Negative for neck pain and neck stiffness.    Cardiovascular: Negative for chest pain.  Gastrointestinal: Negative for nausea.  Genitourinary: Negative.   Musculoskeletal: Negative for gait problem.  Skin: Negative for wound.  Neurological: Positive for numbness. Negative for dizziness, weakness and headaches.    Allergies  Compazine  Home Medications   Current Outpatient Rx  Name  Route  Sig  Dispense  Refill  . CALCITRIOL 0.25 MCG PO CAPS   Oral   Take 0.25 mcg by mouth 2 (two) times daily.         Marland Kitchen CALCIUM CITRATE-VITAMIN D 315-200 MG-UNIT PO TABS   Oral   Take 1 tablet by mouth 2 (two) times daily.         Marland Kitchen VITAMIN D 1000 UNITS PO TABS   Oral   Take 1,000 Units by mouth 2 (two) times daily.         . IRON 325 (65 FE) MG PO TABS   Oral   Take 65 mg by mouth at bedtime.         Marland Kitchen MAGNESIUM 300 MG PO CAPS   Oral   Take 300 mg by mouth at bedtime.         Marland Kitchen NAPROXEN SODIUM 220 MG PO TABS   Oral   Take 440 mg by mouth 2 (two) times daily with a meal. pain         .  SERTRALINE HCL 50 MG PO TABS   Oral   Take 25 mg by mouth daily.         . THYROID 113.75 MG PO TABS   Oral   Take 113.75 mg by mouth daily.           BP 124/62  Pulse 60  Temp 98.3 F (36.8 C) (Oral)  Resp 20  SpO2 98%  LMP 05/21/2012  Physical Exam  Constitutional: She is oriented to person, place, and time. She appears well-developed and well-nourished.  HENT:  Head: Normocephalic.  Eyes: Pupils are equal, round, and reactive to light.  Neck: Normal range of motion. Neck supple. Muscular tenderness present. No spinous process tenderness present. No rigidity. Normal range of motion present.  Cardiovascular: Normal rate and regular rhythm.   Pulmonary/Chest: Effort normal. No respiratory distress.  Abdominal: Soft.  Musculoskeletal: Normal range of motion.       Arms: Neurological: She is alert and oriented to person, place, and time.  Skin: Skin is warm.    ED Course  Procedures (including critical care  time)   Labs Reviewed  CALCIUM  CBC WITH DIFFERENTIAL  POCT I-STAT, CHEM 8   No results found.   1. Muscle pain, cervical       MDM   Discussed medical treatment of her muscle spasm   Refused at this time but will discuss with her PCP       Arman Filter, NP 06/04/12 [id number]

## 2012-06-06 NOTE — ED Provider Notes (Signed)
Medical screening examination/treatment/procedure(s) were performed by non-physician practitioner and as supervising physician I was immediately available for consultation/collaboration.  Milarose Savich R. Sharai Overbay, MD 06/06/12 2319 

## 2012-06-09 ENCOUNTER — Ambulatory Visit
Admission: RE | Admit: 2012-06-09 | Discharge: 2012-06-09 | Disposition: A | Discharge status: Home / Self Care | Payer: BC Managed Care – PPO | Source: Ambulatory Visit | Attending: Gynecology | Admitting: Gynecology

## 2012-06-09 DIAGNOSIS — Z1231 Encounter for screening mammogram for malignant neoplasm of breast: Secondary | ICD-10-CM

## 2013-02-01 ENCOUNTER — Other Ambulatory Visit: Payer: Self-pay | Admitting: Gynecology

## 2013-05-11 ENCOUNTER — Other Ambulatory Visit: Payer: Self-pay

## 2013-05-11 DIAGNOSIS — Z1231 Encounter for screening mammogram for malignant neoplasm of breast: Secondary | ICD-10-CM

## 2013-05-23 DIAGNOSIS — M25561 Pain in right knee: Secondary | ICD-10-CM | POA: Insufficient documentation

## 2013-05-23 DIAGNOSIS — N200 Calculus of kidney: Secondary | ICD-10-CM | POA: Insufficient documentation

## 2013-06-19 ENCOUNTER — Ambulatory Visit
Admission: RE | Admit: 2013-06-19 | Discharge: 2013-06-19 | Disposition: A | Discharge status: Home / Self Care | Payer: BC Managed Care – PPO | Source: Ambulatory Visit

## 2013-06-19 DIAGNOSIS — Z1231 Encounter for screening mammogram for malignant neoplasm of breast: Secondary | ICD-10-CM

## 2013-06-21 ENCOUNTER — Other Ambulatory Visit: Payer: Self-pay | Admitting: Gynecology

## 2013-06-21 DIAGNOSIS — R928 Other abnormal and inconclusive findings on diagnostic imaging of breast: Secondary | ICD-10-CM

## 2013-06-29 ENCOUNTER — Ambulatory Visit
Admission: RE | Admit: 2013-06-29 | Discharge: 2013-06-29 | Disposition: A | Discharge status: Home / Self Care | Payer: BC Managed Care – PPO | Source: Ambulatory Visit | Attending: Gynecology | Admitting: Gynecology

## 2013-06-29 DIAGNOSIS — R928 Other abnormal and inconclusive findings on diagnostic imaging of breast: Secondary | ICD-10-CM

## 2013-08-14 DIAGNOSIS — R34 Anuria and oliguria: Secondary | ICD-10-CM | POA: Insufficient documentation

## 2013-08-14 DIAGNOSIS — R82994 Hypercalciuria: Secondary | ICD-10-CM | POA: Insufficient documentation

## 2013-09-22 ENCOUNTER — Encounter: Payer: Self-pay | Admitting: *Deleted

## 2013-09-27 ENCOUNTER — Ambulatory Visit (INDEPENDENT_AMBULATORY_CARE_PROVIDER_SITE_OTHER): Payer: BC Managed Care – PPO | Admitting: Cardiovascular Disease

## 2013-09-27 ENCOUNTER — Encounter: Payer: Self-pay | Admitting: Cardiovascular Disease

## 2013-09-27 VITALS — BP 121/69 | HR 77 | Ht 66.0 in | Wt 193.0 lb

## 2013-09-27 DIAGNOSIS — R002 Palpitations: Secondary | ICD-10-CM

## 2013-09-27 NOTE — Patient Instructions (Signed)
Your physician recommends that you schedule a follow-up appointment in: 3 months with Dr Berry 

## 2013-09-27 NOTE — Assessment & Plan Note (Signed)
Patient was sent to me by Dr. Delmar Landau for dilation of palpitations. She's had these for many years and in the past these have been more noticeable during pregnancy. She also has had Hashimoto's disease and thyroid cancer surgically treated. Impartially her parathyroids were removed as well inadvertently. She is on vitamin D, calcium replacement and thyroid replacement. Over the last several weeks his dose increasing frequency and severity of her palpitations. There are no other associated symptoms. Her thyroid medicines are in the process of being readjusted as she is hyperthyroid currently. We have talked about getting a 2-D echo and an event monitor which she's had on multiple occasions in the past however at this point, we're going to continue to follow her conservatively and allow her thyroid medicines to be  optimized. I will see her back in 16months.

## 2013-09-27 NOTE — Progress Notes (Signed)
09/27/2013 LATASIA SILBERSTEIN   1970-06-12  [id number]  Primary Physician Horatio Pel, MD Primary Cardiologist: Lorretta Harp MD Renae Gloss   HPI:  Ms. Amiri is a 43 year old mildly overweight married Caucasian female mother of 3 children who raises her children at home and school family at home as well. She was previously a patient of Dr. Durwin Nora  Little's who saw her 06/27/10 for palpitations and atypical chest pain. She has had Hashimoto's disease, thyroid cancer status post surgical excision which and partially removed parathyroids as well. She's had palpitations for many years, worse during her pregnancies. Over the last 4-6 weeks she's been increased frequency and severity of her palpitations more noticeable at night.   Current Outpatient Prescriptions  Medication Sig Dispense Refill  . ascorbic acid (VITAMIN C) 1000 MG tablet Take 1,000 mg by mouth daily.      . calcitRIOL (ROCALTROL) 0.25 MCG capsule Take 0.25 mcg by mouth 2 (two) times daily.      . calcium citrate-vitamin D (CITRACAL+D) 315-200 MG-UNIT per tablet Take 1 tablet by mouth 2 (two) times daily.      . cholecalciferol (VITAMIN D) 1000 UNITS tablet Take 1,000 Units by mouth 2 (two) times daily.      . Ferrous Sulfate (IRON) 90 (18 FE) MG TABS Take by mouth.      . Magnesium 200 MG TABS Take 2 tablets by mouth daily.       . naproxen sodium (ALEVE) 220 MG tablet Take 440 mg by mouth 2 (two) times daily with a meal. pain      . PROAIR HFA 108 (90 BASE) MCG/ACT inhaler       . Thyroid 113.75 MG TABS Take 113.75 mg by mouth. Alternating with 97.5mg  daily      . zinc gluconate 50 MG tablet Take 50 mg by mouth every other day.      . hydrochlorothiazide (HYDRODIURIL) 12.5 MG tablet 1/2 tablet by mouth daily for 4 weeks, and then increase to 1 tablet by mouth daily       No current facility-administered medications for this visit.    Allergies  Allergen Reactions  . Codeine Itching  . Compazine  [Prochlorperazine Edisylate] Other (See Comments)    Heart rate   . Latex   . Metoclopramide Other (See Comments)    akathisia  . Sertraline Other (See Comments)    akathisia    History   Social History  . Marital Status: Married    Spouse Name: N/A    Number of Children: N/A  . Years of Education: N/A   Occupational History  . Not on file.   Social History Main Topics  . Smoking status: Never Smoker   . Smokeless tobacco: Not on file  . Alcohol Use: Yes     Comment: occasional  . Drug Use: No  . Sexual Activity:    Other Topics Concern  . Not on file   Social History Narrative  . No narrative on file     Review of Systems: General: negative for chills, fever, night sweats or weight changes.  Cardiovascular: negative for chest pain, dyspnea on exertion, edema, orthopnea, palpitations, paroxysmal nocturnal dyspnea or shortness of breath Dermatological: negative for rash Respiratory: negative for cough or wheezing Urologic: negative for hematuria Abdominal: negative for nausea, vomiting, diarrhea, bright red blood per rectum, melena, or hematemesis Neurologic: negative for visual changes, syncope, or dizziness All other systems reviewed and are otherwise negative except as noted  above.    Blood pressure 121/69, pulse 77, height 5\' 6"  (1.676 m), weight 193 lb (87.544 kg).  General appearance: alert and no distress Neck: no adenopathy, no carotid bruit, no JVD, supple, symmetrical, trachea midline and thyroid not enlarged, symmetric, no tenderness/mass/nodules Lungs: clear to auscultation bilaterally Heart: regular rate and rhythm, S1, S2 normal, no murmur, click, rub or gallop Abdomen: soft, non-tender; bowel sounds normal; no masses,  no organomegaly Pulses: 2+ and symmetric  EKG not performed today  ASSESSMENT AND PLAN:   Palpitations Patient was sent to me by Dr. Delmar Landau for dilation of palpitations. She's had these for many years and in the past these  have been more noticeable during pregnancy. She also has had Hashimoto's disease and thyroid cancer surgically treated. Impartially her parathyroids were removed as well inadvertently. She is on vitamin D, calcium replacement and thyroid replacement. Over the last several weeks his dose increasing frequency and severity of her palpitations. There are no other associated symptoms. Her thyroid medicines are in the process of being readjusted as she is hyperthyroid currently. We have talked about getting a 2-D echo and an event monitor which she's had on multiple occasions in the past however at this point, we're going to continue to follow her conservatively and allow her thyroid medicines to be  optimized. I will see her back in 64months.      Lorretta Harp MD FACP,FACC,FAHA, [facility] 09/27/2013 5:07 PM

## 2013-09-28 ENCOUNTER — Encounter: Payer: Self-pay | Admitting: Cardiology

## 2013-12-27 ENCOUNTER — Ambulatory Visit: Payer: BC Managed Care – PPO | Admitting: Cardiovascular Disease

## 2014-01-10 ENCOUNTER — Other Ambulatory Visit: Payer: Self-pay | Admitting: Gynecology

## 2014-01-10 DIAGNOSIS — IMO0002 Reserved for concepts with insufficient information to code with codable children: Secondary | ICD-10-CM

## 2014-01-12 ENCOUNTER — Other Ambulatory Visit: Payer: BC Managed Care – PPO

## 2014-01-17 ENCOUNTER — Ambulatory Visit
Admission: RE | Admit: 2014-01-17 | Discharge: 2014-01-17 | Disposition: A | Discharge status: Home / Self Care | Payer: BC Managed Care – PPO | Source: Ambulatory Visit | Attending: Gynecology | Admitting: Gynecology

## 2014-01-17 DIAGNOSIS — IMO0002 Reserved for concepts with insufficient information to code with codable children: Secondary | ICD-10-CM

## 2014-01-31 ENCOUNTER — Other Ambulatory Visit: Payer: Self-pay | Admitting: Gynecology

## 2014-02-01 LAB — CYTOLOGY - PAP

## 2014-05-09 DIAGNOSIS — C73 Malignant neoplasm of thyroid gland: Secondary | ICD-10-CM | POA: Insufficient documentation

## 2014-05-17 ENCOUNTER — Other Ambulatory Visit: Payer: Self-pay

## 2014-05-17 DIAGNOSIS — Z1231 Encounter for screening mammogram for malignant neoplasm of breast: Secondary | ICD-10-CM

## 2014-06-21 ENCOUNTER — Ambulatory Visit
Admission: RE | Admit: 2014-06-21 | Discharge: 2014-06-21 | Disposition: A | Discharge status: Home / Self Care | Payer: BLUE CROSS/BLUE SHIELD | Source: Ambulatory Visit

## 2014-06-21 DIAGNOSIS — Z1231 Encounter for screening mammogram for malignant neoplasm of breast: Secondary | ICD-10-CM

## 2015-02-20 ENCOUNTER — Other Ambulatory Visit: Payer: Self-pay | Admitting: Gynecology

## 2015-02-21 LAB — CYTOLOGY - PAP

## 2015-05-13 ENCOUNTER — Other Ambulatory Visit: Payer: Self-pay

## 2015-05-13 DIAGNOSIS — Z1231 Encounter for screening mammogram for malignant neoplasm of breast: Secondary | ICD-10-CM

## 2015-06-24 ENCOUNTER — Ambulatory Visit
Admission: RE | Admit: 2015-06-24 | Discharge: 2015-06-24 | Disposition: A | Discharge status: Home / Self Care | Payer: BLUE CROSS/BLUE SHIELD | Source: Ambulatory Visit

## 2015-06-24 DIAGNOSIS — Z1231 Encounter for screening mammogram for malignant neoplasm of breast: Secondary | ICD-10-CM

## 2015-09-18 DIAGNOSIS — T63441A Toxic effect of venom of bees, accidental (unintentional), initial encounter: Secondary | ICD-10-CM | POA: Diagnosis not present

## 2015-12-11 DIAGNOSIS — C73 Malignant neoplasm of thyroid gland: Secondary | ICD-10-CM | POA: Diagnosis not present

## 2015-12-11 DIAGNOSIS — E892 Postprocedural hypoparathyroidism: Secondary | ICD-10-CM | POA: Diagnosis not present

## 2015-12-11 DIAGNOSIS — E890 Postprocedural hypothyroidism: Secondary | ICD-10-CM | POA: Diagnosis not present

## 2016-01-09 DIAGNOSIS — R5383 Other fatigue: Secondary | ICD-10-CM | POA: Diagnosis not present

## 2016-01-09 DIAGNOSIS — R002 Palpitations: Secondary | ICD-10-CM | POA: Diagnosis not present

## 2016-01-20 DIAGNOSIS — R008 Other abnormalities of heart beat: Secondary | ICD-10-CM | POA: Diagnosis not present

## 2016-01-23 DIAGNOSIS — E890 Postprocedural hypothyroidism: Secondary | ICD-10-CM | POA: Diagnosis not present

## 2016-01-23 DIAGNOSIS — C73 Malignant neoplasm of thyroid gland: Secondary | ICD-10-CM | POA: Diagnosis not present

## 2016-01-23 DIAGNOSIS — E892 Postprocedural hypoparathyroidism: Secondary | ICD-10-CM | POA: Diagnosis not present

## 2016-03-18 DIAGNOSIS — J029 Acute pharyngitis, unspecified: Secondary | ICD-10-CM | POA: Diagnosis not present

## 2016-06-29 ENCOUNTER — Ambulatory Visit (INDEPENDENT_AMBULATORY_CARE_PROVIDER_SITE_OTHER): Payer: BLUE CROSS/BLUE SHIELD | Admitting: Otolaryngology

## 2016-06-29 DIAGNOSIS — R070 Pain in throat: Secondary | ICD-10-CM | POA: Diagnosis not present

## 2016-06-29 DIAGNOSIS — N200 Calculus of kidney: Secondary | ICD-10-CM | POA: Diagnosis not present

## 2016-07-13 DIAGNOSIS — N200 Calculus of kidney: Secondary | ICD-10-CM | POA: Diagnosis not present

## 2016-08-25 DIAGNOSIS — J3501 Chronic tonsillitis: Secondary | ICD-10-CM | POA: Diagnosis not present

## 2016-09-07 DIAGNOSIS — E559 Vitamin D deficiency, unspecified: Secondary | ICD-10-CM | POA: Diagnosis not present

## 2016-09-07 DIAGNOSIS — Z0000 Encounter for general adult medical examination without abnormal findings: Secondary | ICD-10-CM | POA: Diagnosis not present

## 2016-09-07 DIAGNOSIS — N390 Urinary tract infection, site not specified: Secondary | ICD-10-CM | POA: Diagnosis not present

## 2016-09-10 DIAGNOSIS — E039 Hypothyroidism, unspecified: Secondary | ICD-10-CM | POA: Diagnosis not present

## 2016-09-10 DIAGNOSIS — E559 Vitamin D deficiency, unspecified: Secondary | ICD-10-CM | POA: Diagnosis not present

## 2016-09-10 DIAGNOSIS — Z0000 Encounter for general adult medical examination without abnormal findings: Secondary | ICD-10-CM | POA: Diagnosis not present

## 2016-11-10 ENCOUNTER — Ambulatory Visit (INDEPENDENT_AMBULATORY_CARE_PROVIDER_SITE_OTHER): Payer: BLUE CROSS/BLUE SHIELD | Admitting: Allergy and Immunology

## 2016-11-10 ENCOUNTER — Encounter: Payer: Self-pay | Admitting: Allergy and Immunology

## 2016-11-10 VITALS — BP 112/78 | HR 80 | Resp 16 | Ht 65.0 in | Wt 191.9 lb

## 2016-11-10 DIAGNOSIS — H1045 Other chronic allergic conjunctivitis: Secondary | ICD-10-CM

## 2016-11-10 DIAGNOSIS — J3089 Other allergic rhinitis: Secondary | ICD-10-CM | POA: Diagnosis not present

## 2016-11-10 DIAGNOSIS — H1010 Acute atopic conjunctivitis, unspecified eye: Secondary | ICD-10-CM

## 2016-11-10 DIAGNOSIS — J4530 Mild persistent asthma, uncomplicated: Secondary | ICD-10-CM

## 2016-11-10 MED ORDER — MONTELUKAST SODIUM 10 MG PO TABS: 10.0000 mg | ORAL_TABLET | Freq: Every day | ORAL | 5 refills | Status: DC

## 2016-11-10 NOTE — Patient Instructions (Addendum)
  1. Use a combination of loratadine 10 mg and montelukast 10 mg one time per day  2. If needed: ProAir HFA or similar 2 inhalations every 4-6 hours  3. Further evaluation and treatment? Yes, if still symptomatic  4. Immunotherapy?  5. Perform avoidance measures regarding dog including weekly washing and HEPA filter  6. Contact me in 2 weeks regarding response to treatment

## 2016-11-10 NOTE — Progress Notes (Signed)
Follow-up Note  Referring Provider: Deland Pretty, MD Primary Provider: Deland Pretty, MD Date of Office Visit: 11/10/2016  Subjective:   [name] (DOB: Oct 12, 1970) is a 46 y.o. female who returns to the [facility] [place] on 11/10/2016 in re-evaluation of the following:  HPI: [name] returns to this clinic in reevaluation of respiratory tract problems that have occurred over the course of the past several weeks. I have not seen her in this clinic since March 2015.  A dog has been introduced into the house over the course of the past 2-1/2 weeks and she has developed some global itchiness and occasional itchiness and heaviness in her chest and itchy eyes and some occasional throat burning and she used an old rescue inhaler that she had which did result in resolution of her chest symptoms. She now has a HEPA filter located inside her bedroom and inside the great room.  She does have a history of dust mite allergy and has performed dust mite avoidance measures which resulted in very good control of her previously identified atopic disease.  Allergies as of 11/10/2016      Reactions   Ciprofloxacin    Codeine Itching   Compazine [prochlorperazine Edisylate] Other (See Comments)   Heart rate    Latex    Metoclopramide Other (See Comments)   akathisia   Sertraline Other (See Comments)   akathisia   Benadryl [diphenhydramine Hcl (sleep)] Anxiety      Medication List      calcitRIOL 0.25 MCG capsule Commonly known as:  ROCALTROL Take 0.25 mcg by mouth 2 (two) times daily. Alternating days of 2 capsules and 1 capsule.   CALCIUM CITRATE + D 315-250 MG-UNIT Tabs Generic drug:  Calcium Citrate-Vitamin D Take by mouth 2 (two) times daily.   hydrochlorothiazide 12.5 MG tablet Commonly known as:  HYDRODIURIL Take by mouth.   Magnesium 200 MG Tabs Take 2 tablets by mouth daily.   PROAIR HFA 108 (90 Base) MCG/ACT inhaler Generic drug:  albuterol   Thyroid  113.75 MG Tabs Take 113.75 mg by mouth every other day. Alternating with Armour Thyroid 90mg .   ARMOUR THYROID 90 MG tablet Generic drug:  thyroid Take 90 mg by mouth every other day. Alternating with Petra Kuba Thyroid 113.75mg .   Vitamin D3 1000 units Caps Take by mouth 2 (two) times daily.       Past Medical History:  Diagnosis Date  . Akathisia   . Endometriosis   . Hypocalcemia   . Hypoparathyroidism ([place])   . Hypothyroidism   . Kidney stones   . Latex allergy   . Palpitations   . Thyroid disease    no thyroid or parathyroid, being followed by [facility]ia [phone])    Past Surgical History:  Procedure Laterality Date  . ABDOMINAL SURGERY    . CESAREAN SECTION  2001  . NM MYOCAR PERF WALL MOTION  07/09/2010   protocol: Bruce, normal prefusion, no evidece of ischemia, exercise capacity 10METS  . OTHER SURGICAL HISTORY     Laparoscopic surgery for endometriosis  . THYROIDECTOMY      Review of systems negative except as noted in HPI / PMHx or noted below:  Review of Systems  Constitutional: Negative.   HENT: Negative.   Eyes: Negative.   Respiratory: Negative.   Cardiovascular: Negative.   Gastrointestinal: Negative.   Genitourinary: Negative.   Musculoskeletal: Negative.   Skin: Negative.   Neurological: Negative.   Endo/Heme/Allergies: Negative.  Psychiatric/Behavioral: Negative.      Objective:   Vitals:   11/10/16 1751  BP: 112/78  Pulse: 80  Resp: 16   Height: 5\' 5"  (165.1 cm)  Weight: 191 lb 14.4 oz (87 kg)   Physical Exam  Constitutional: She is well-developed, well-nourished, and in no distress.  HENT:  Head: Normocephalic.  Right Ear: Tympanic membrane, external ear and ear canal normal.  Left Ear: Tympanic membrane, external ear and ear canal normal.  Nose: Nose normal. No mucosal edema or rhinorrhea.  Mouth/Throat: Uvula is midline, oropharynx is clear and moist and mucous membranes are normal. No oropharyngeal  exudate.  Eyes: Conjunctivae are normal.  Neck: Trachea normal. No tracheal tenderness present. No tracheal deviation present. No thyromegaly present.  Cardiovascular: Normal rate, regular rhythm, S1 normal, S2 normal and normal heart sounds.   No murmur heard. Pulmonary/Chest: Breath sounds normal. No stridor. No respiratory distress. She has no wheezes. She has no rales.  Musculoskeletal: She exhibits no edema.  Lymphadenopathy:       Head (right side): No tonsillar adenopathy present.       Head (left side): No tonsillar adenopathy present.    She has no cervical adenopathy.  Neurological: She is alert. Gait normal.  Skin: No rash noted. She is not diaphoretic. No erythema. Nails show no clubbing.  Psychiatric: Mood and affect normal.    Diagnostics:    Spirometry was performed and demonstrated an FEV1 of 2.85 at 102 % of predicted.  The patient had an Asthma Control Test with the following results:  .    Assessment and Plan:   1. Not well controlled mild persistent asthma   2. Other allergic rhinitis   3. Seasonal allergic conjunctivitis     1. Use a combination of loratadine 10 mg and montelukast 10 mg one time per day  2. If needed: ProAir HFA or similar 2 inhalations every 4-6 hours  3. Further evaluation and treatment? Yes, if still symptomatic  4. Immunotherapy?  5. Perform avoidance measures regarding dog including weekly washing and HEPA filter  6. Contact me in 2 weeks regarding response to treatment  I will have [name] perform allergen avoidance measures as best as possible against her dog and give her a combination of an H1 receptor blocker and a leukotriene modifier and we will see how things go over the course of the next several weeks. She will contact me by telephone noting her response to this approach. There is always the option of immunotherapy if this plan does not result in good control of her condition.  Allena Katz, MD Allergy / Immunology [place]

## 2016-11-11 ENCOUNTER — Telehealth: Payer: Self-pay | Admitting: Allergy and Immunology

## 2016-11-11 NOTE — Telephone Encounter (Signed)
Patient has general questions about alternatives to medications for her asthma and other questions concerning her diagnoses from yesterday.

## 2016-11-11 NOTE — Telephone Encounter (Signed)
Spoke to patient and she told me that she is giving back the puppy and will not be taking the medications.

## 2016-11-11 NOTE — Telephone Encounter (Signed)
Called patient. Patient informed me that she is not comfortable taking loratadine or montelukast. She has very high anxiety. Patient googled the medications and said that it has ingredients that can cause her to lose her mine( manic episode). Patient wants to know if there is a drug free option because she just got a puppy.  Please advise

## 2016-11-11 NOTE — Telephone Encounter (Signed)
She should start the medications at 50% dose (1/2 tab) and if there is a problem then she can stop the medications. There will NOT be any permanent side effect from using the medications. She can consider immunotherapy or allergen avoidance measures concerning the dog as a non drug approach to this issue.

## 2016-11-16 ENCOUNTER — Other Ambulatory Visit: Payer: Self-pay | Admitting: Internal Medicine

## 2016-11-16 DIAGNOSIS — Z1231 Encounter for screening mammogram for malignant neoplasm of breast: Secondary | ICD-10-CM

## 2016-12-15 ENCOUNTER — Ambulatory Visit
Admission: RE | Admit: 2016-12-15 | Discharge: 2016-12-15 | Disposition: A | Discharge status: Home / Self Care | Payer: BLUE CROSS/BLUE SHIELD | Source: Ambulatory Visit | Attending: Internal Medicine | Admitting: Internal Medicine

## 2016-12-15 ENCOUNTER — Encounter (INDEPENDENT_AMBULATORY_CARE_PROVIDER_SITE_OTHER): Payer: Self-pay

## 2016-12-15 DIAGNOSIS — Z1231 Encounter for screening mammogram for malignant neoplasm of breast: Secondary | ICD-10-CM

## 2016-12-16 ENCOUNTER — Other Ambulatory Visit: Payer: Self-pay | Admitting: Internal Medicine

## 2016-12-16 DIAGNOSIS — R928 Other abnormal and inconclusive findings on diagnostic imaging of breast: Secondary | ICD-10-CM

## 2016-12-18 ENCOUNTER — Ambulatory Visit
Admission: RE | Admit: 2016-12-18 | Discharge: 2016-12-18 | Disposition: A | Discharge status: Home / Self Care | Payer: BLUE CROSS/BLUE SHIELD | Source: Ambulatory Visit | Attending: Internal Medicine | Admitting: Internal Medicine

## 2016-12-18 DIAGNOSIS — N6489 Other specified disorders of breast: Secondary | ICD-10-CM | POA: Diagnosis not present

## 2016-12-18 DIAGNOSIS — R922 Inconclusive mammogram: Secondary | ICD-10-CM | POA: Diagnosis not present

## 2016-12-18 DIAGNOSIS — R928 Other abnormal and inconclusive findings on diagnostic imaging of breast: Secondary | ICD-10-CM

## 2016-12-30 DIAGNOSIS — C73 Malignant neoplasm of thyroid gland: Secondary | ICD-10-CM | POA: Diagnosis not present

## 2017-01-21 DIAGNOSIS — J029 Acute pharyngitis, unspecified: Secondary | ICD-10-CM | POA: Diagnosis not present

## 2017-02-04 ENCOUNTER — Ambulatory Visit (HOSPITAL_COMMUNITY)
Admission: EM | Admit: 2017-02-04 | Discharge: 2017-02-04 | Disposition: A | Discharge status: Home / Self Care | Payer: BLUE CROSS/BLUE SHIELD | Attending: Internal Medicine | Admitting: Internal Medicine

## 2017-02-04 ENCOUNTER — Encounter (HOSPITAL_COMMUNITY): Payer: Self-pay | Admitting: Nurse Practitioner

## 2017-02-04 DIAGNOSIS — J0140 Acute pansinusitis, unspecified: Secondary | ICD-10-CM

## 2017-02-04 HISTORY — DX: Malignant (primary) neoplasm, unspecified: C80.1

## 2017-02-04 MED ORDER — PREDNISONE 50 MG PO TABS: ORAL_TABLET | ORAL | 0 refills | Status: DC

## 2017-02-04 MED ORDER — AMOXICILLIN-POT CLAVULANATE 875-125 MG PO TABS: 1.0000 | ORAL_TABLET | Freq: Two times a day (BID) | ORAL | 0 refills | Status: DC

## 2017-02-04 NOTE — Discharge Instructions (Signed)
Your symptoms are consistent with acute sinusitis, I prescribed Augmentin, one tablet twice a day for one week. In a short course of prednisone, one tablet daily for 5 days with food. Recommend rest, plenty of fluids, if your symptoms persist past one week, follow-up with your regular doctor for reevaluation

## 2017-02-04 NOTE — ED Provider Notes (Signed)
[place]   [id number] 02/04/17 Arrival Time: 1750   SUBJECTIVE:  [name] is a 46 y.o. female who presents to the urgent care with complaint of sinus pain and pressure for 2 weeks. Also has had pain along her teeth as well. No fever or chills, has had cough that is non-productive. Along with cough, she has had feeling of chest tightness, is worse with movement and cough. 2 weeks ago she was diagnosed with strep and started on azithromycin, her sore throat has improved, but she has developed these other symptoms     Past Medical History:  Diagnosis Date  . Akathisia   . Cancer ([place])   . Endometriosis   . Hypocalcemia   . Hypoparathyroidism ([place])   . Hypothyroidism   . Kidney stones   . Latex allergy   . Palpitations   . Thyroid disease    no thyroid or parathyroid, being followed by [phone])   Family History  Problem Relation Age of Onset  . Adopted: Yes  . Food Allergy Daughter        Tree Nut Allergy   Social History   Social History  . Marital status: Married    Spouse name: N/A  . Number of children: N/A  . Years of education: N/A   Occupational History  . Not on file.   Social History Main Topics  . Smoking status: Never Smoker  . Smokeless tobacco: Never Used  . Alcohol use No  . Drug use: No  . Sexual activity: Not on file   Other Topics Concern  . Not on file   Social History Narrative  . No narrative on file   No outpatient prescriptions have been marked as taking for the 02/04/17 encounter [facility] Encounter).   Allergies  Allergen Reactions  . Ciprofloxacin   . Codeine Itching  . Compazine [Prochlorperazine Edisylate] Other (See Comments)    Heart rate   . Latex   . Metoclopramide Other (See Comments)    akathisia  . Sertraline Other (See Comments)    akathisia  . Benadryl [Diphenhydramine Hcl (Sleep)] Anxiety      ROS: As per HPI, remainder of ROS  negative.   OBJECTIVE:   Vitals:   02/04/17 1836  BP: 130/80  Pulse: 93  Resp: 18  Temp: 98.9 F (37.2 C)  TempSrc: Oral  SpO2: 100%     General appearance: alert; no distress Eyes: PERRL; EOMI; conjunctiva normal HENT: normocephalic; atraumatic; TMs normal, canal normal, external ears normal without trauma; Turbinates are boggy; oral mucosa normal Neck: supple, no cervical lymphadenopathy Lungs: clear to auscultation bilaterally Heart: regular rate and rhythm Abdomen: soft, non-tender; bowel sounds normal; no masses or organomegaly; no guarding or rebound tenderness Back: no CVA tenderness Extremities: no cyanosis or edema; symmetrical with no gross deformities Skin: warm and dry Neurologic: normal gait; grossly normal Psychological: alert and cooperative; normal mood and affect      Labs:  Results for orders placed or performed in visit on 02/20/15  Cytology - PAP  Result Value Ref Range   CYTOLOGY - PAP PAP RESULT     Labs Reviewed - No data to display  No results found.     ASSESSMENT & PLAN:  1. Acute non-recurrent pansinusitis     Meds ordered this encounter  Medications  . amoxicillin-clavulanate (AUGMENTIN) 875-125 MG tablet    Sig: Take 1 tablet by mouth 2 (two) times daily.    Dispense:  14 tablet    Refill:  0    Order Specific Question:   Supervising Provider    Answer:   Sherlene Shams [[id number]]  . predniSONE (DELTASONE) 50 MG tablet    Sig: Take 1 tablet daily with food    Dispense:  5 tablet    Refill:  0    Order Specific Question:   Supervising Provider    Answer:   Sherlene Shams [[id number]]   If symptoms persist, follow-up with her primary care provider in one week as needed Reviewed expectations re: course of current medical issues. Questions answered. Outlined signs and symptoms indicating need for more acute intervention. Patient verbalized understanding. After Visit Summary given.    Procedures:        Barnet Glasgow, NP 02/04/17 1931

## 2017-02-04 NOTE — ED Triage Notes (Addendum)
Pt presents with c/o URI. Her symptoms began about 2 weeks ago. She was seen last week by her primary doctor for sore throat, fevers and treated for a sinus infection with zpak. Her symptoms have not improved and this week shes begun to have headache, chest congestion, cough, tightness in her chest, and overall feels worse

## 2017-02-05 DIAGNOSIS — Z01419 Encounter for gynecological examination (general) (routine) without abnormal findings: Secondary | ICD-10-CM | POA: Diagnosis not present

## 2017-02-05 DIAGNOSIS — Z6831 Body mass index (BMI) 31.0-31.9, adult: Secondary | ICD-10-CM | POA: Diagnosis not present

## 2017-02-05 DIAGNOSIS — N841 Polyp of cervix uteri: Secondary | ICD-10-CM | POA: Diagnosis not present

## 2017-02-10 LAB — HM PAP SMEAR: HM Pap smear: ABNORMAL

## 2017-02-18 ENCOUNTER — Ambulatory Visit (INDEPENDENT_AMBULATORY_CARE_PROVIDER_SITE_OTHER): Payer: BLUE CROSS/BLUE SHIELD | Admitting: Allergy

## 2017-02-18 ENCOUNTER — Encounter: Payer: Self-pay | Admitting: Allergy

## 2017-02-18 VITALS — BP 110/80 | HR 72 | Temp 98.1°F | Resp 16

## 2017-02-18 DIAGNOSIS — E892 Postprocedural hypoparathyroidism: Secondary | ICD-10-CM | POA: Insufficient documentation

## 2017-02-18 DIAGNOSIS — E890 Postprocedural hypothyroidism: Secondary | ICD-10-CM | POA: Insufficient documentation

## 2017-02-18 DIAGNOSIS — J4540 Moderate persistent asthma, uncomplicated: Secondary | ICD-10-CM

## 2017-02-18 DIAGNOSIS — L509 Urticaria, unspecified: Secondary | ICD-10-CM | POA: Diagnosis not present

## 2017-02-18 NOTE — Patient Instructions (Addendum)
Hives of unknown origin, likely triggered by recent viral infection: - Begin taking Allegra 1-2 tabs a day for at least 1 week after hives resolve - Reserve Benadryl for break through hives - Follow up with Dr. Neldon Mc for skin or patch testing  Moderate Persistent Asthma: - Continue to use PorAir Albuterol inhaler 1-2 inhalations every 4 hours as needed - Asthma control goals:   Full participation in all desired activities (may need albuterol before activity)  Albuterol use two time or less a week on average (not counting use with activity)  Cough interfering with sleep two time or less a month  Oral steroids no more than once a year  No hospitalizations

## 2017-02-18 NOTE — Progress Notes (Signed)
FOLLOW UP  Date of Service/Encounter:  02/18/17   Assessment:   Hives of unknown origin  Moderate persistent asthma without complication     Plan/Recommendations:   Hives of unknown origin, likely triggered by recent viral infection: - Begin taking Allegra 1-2 tabs a day for at least 1 week after hives resolve.  She is very hesitant to start any new medications.  Discussed benefits and side effects a/w Allegra and believe she should have less anxiousness with Allegra vs benadryl use.   - Reserve Benadryl for break through hives - Follow up with Dr. Neldon Mc for skin or patch testing -- pt questions about testing for latex and shrimp today.  She has taking benadryl in the last 24 hr and thus discussed unable to provide any skin testing today.  Did offer serum IgE testing for shellfish but she would like to wait on this  Moderate Persistent Asthma: - Continue to use PorAir Albuterol inhaler 1-2 inhalations every 4 hours as needed - Asthma control goals:   Full participation in all desired activities (may need albuterol before activity)  Albuterol use two time or less a week on average (not counting use with activity)  Cough interfering with sleep two time or less a month  Oral steroids no more than once a year  No hospitalizations   Follow-up 3-4 months or sooner if needed with Dr. Neldon Mc   Subjective:   History obtained from: chart review and patient interview.  [name]'s Primary Care Provider is Deland Pretty,  MD.     [name] is a 46 y.o. female presenting for a sick visit. She last saw Dr. Neldon Mc on 11/10/2016 for evaluation of respiratory tract problems that began after the family adopted a dog and resolved with the return of the dog.    Since the last visit, she has developed hives which began 4 days ago around her eyes and moved to her hands, back and legs. She reports the hives vary in size and are very itchy. [name] has been taking baking soda baths and 1  teaspoon of Benadryl about every 12 hours with resolution of itch and hives but she reports this medicine is making her anxious. She reports having used a brand of feminine products that she does not normally use and feeling a burning sensation in the perineal area with placement of the pad. [name] reports that she may have consumed shrimp in the past week.  She does states she does not eat shellfish often.  One month ago [name] was diagnosed with strep throat and was given azithromycin followed 1 week later by a suspected sinus infection and tight feeling in her lungs which was treated with Augmentin. She denies cardiopulmonary or gastrointestinal issues since her last visit. Her goals for today's visit include possible skin or patch testing with Dr. Neldon Mc.  Otherwise, there have been no changes to her past medical history, surgical history, family history, or social history.  Review of Systems: a 14-point review of systems is pertinent for what is mentioned in HPI.  Otherwise, all other systems were negative. Constitutional: negative other than that listed in the HPI Eyes: negative other than that listed in the HPI Ears, nose, mouth, throat, and face: negative other than that listed in the HPI Respiratory: negative other than that listed in the HPI Cardiovascular: negative other than that listed in the HPI Gastrointestinal: negative other than that listed in the HPI Genitourinary: negative other than that listed in the HPI Integument: negative  other than that listed in the HPI Hematologic: negative other than that listed in the HPI Musculoskeletal: negative other than that listed in the HPI Neurological: negative other than that listed in the HPI Allergy/Immunologic: negative other than that listed in the HPI    Objective:   Blood pressure 110/80, pulse 72, temperature 98.1 F (36.7 C), temperature source Oral, resp. rate 16.   Physical Exam:  General: Alert, interactive, in no acute  distress. Eyes: No conjunctival injection present on the right and No conjunctival injection present on the left Ears: Right TM pearly gray with normal light reflex and Left TM pearly gray with normal light reflex.  Nose/Throat: External nose within normal limits and septum midline, turbinates minimally edematous without discharge, post-pharynx mildly erythematous without cobblestoning in the posterior oropharynx. Tonsils unremarklable without exudates Neck: Supple without thyromegaly. Lungs: Clear to auscultation without wheezing, rhonchi or rales. No increased work of breathing. CV: Normal S1/S2, no murmurs. Capillary refill <2 seconds.  Skin: Warm and dry, without lesions or rashes. Neuro:   Grossly intact. No focal deficits appreciated. Responsive to questions.  Spirometry:  FEV1: 2.96/106%, FVC: 3.54/105%, FEV1R: .83/101%   Dara Hoyer, FNP-C [facility] [place]    I performed a history and physical examination of the patient and discussed management with the NP Ambs. I reviewed the NP's note and agree with the documented findings and plan of care. The note in its entirety was edited by myself, including the history, physical exam, assessment, and plan.   Prudy Feeler, MD [facility]

## 2017-03-02 ENCOUNTER — Emergency Department (HOSPITAL_COMMUNITY)
Admission: EM | Admit: 2017-03-02 | Discharge: 2017-03-02 | Disposition: A | Discharge status: Home / Self Care | Payer: BLUE CROSS/BLUE SHIELD | Attending: Emergency Medicine | Admitting: Emergency Medicine

## 2017-03-02 ENCOUNTER — Encounter (HOSPITAL_COMMUNITY): Payer: Self-pay | Admitting: Emergency Medicine

## 2017-03-02 DIAGNOSIS — R109 Unspecified abdominal pain: Secondary | ICD-10-CM | POA: Diagnosis not present

## 2017-03-02 DIAGNOSIS — Z5321 Procedure and treatment not carried out due to patient leaving prior to being seen by health care provider: Secondary | ICD-10-CM | POA: Diagnosis not present

## 2017-03-02 LAB — LIPASE, BLOOD: LIPASE: 26 U/L (ref 11–51)

## 2017-03-02 LAB — COMPREHENSIVE METABOLIC PANEL
ALK PHOS: 88 U/L (ref 38–126)
ALT: 22 U/L (ref 14–54)
AST: 18 U/L (ref 15–41)
Albumin: 3.9 g/dL (ref 3.5–5.0)
Anion gap: 4 — ABNORMAL LOW (ref 5–15)
BUN: 11 mg/dL (ref 6–20)
CALCIUM: 8.6 mg/dL — AB (ref 8.9–10.3)
CO2: 27 mmol/L (ref 22–32)
Chloride: 101 mmol/L (ref 101–111)
Creatinine, Ser: 1.2 mg/dL — ABNORMAL HIGH (ref 0.44–1.00)
GFR calc Af Amer: >60 mL/min (ref >60)
GFR calc non Af Amer: 54 mL/min — ABNORMAL LOW (ref >60)
Glucose, Bld: 120 mg/dL — ABNORMAL HIGH (ref 65–99)
Potassium: 3.3 mmol/L — ABNORMAL LOW (ref 3.5–5.1)
SODIUM: 132 mmol/L — AB (ref 135–145)
Total Bilirubin: 0.6 mg/dL (ref 0.3–1.2)
Total Protein: 6.7 g/dL (ref 6.5–8.1)

## 2017-03-02 LAB — CBC
HCT: 37.7 % (ref 36.0–46.0)
HEMOGLOBIN: 12 g/dL (ref 12.0–15.0)
MCH: 25.3 pg — AB (ref 26.0–34.0)
MCHC: 31.8 g/dL (ref 30.0–36.0)
MCV: 79.4 fL (ref 78.0–100.0)
Platelets: 296 10*3/uL (ref 150–400)
RBC: 4.75 MIL/uL (ref 3.87–5.11)
RDW: 13.4 % (ref 11.5–15.5)
WBC: 8.9 10*3/uL (ref 4.0–10.5)

## 2017-03-02 LAB — PREGNANCY, URINE: Preg Test, Ur: NEGATIVE

## 2017-03-02 LAB — URINALYSIS, ROUTINE W REFLEX MICROSCOPIC
BILIRUBIN URINE: NEGATIVE
GLUCOSE, UA: NEGATIVE mg/dL
KETONES UR: NEGATIVE mg/dL
NITRITE: POSITIVE — AB
PROTEIN: NEGATIVE mg/dL
Specific Gravity, Urine: 1.012 (ref 1.005–1.030)
Squamous Epithelial / LPF: NONE SEEN
pH: 6 (ref 5.0–8.0)

## 2017-03-02 MED ORDER — ONDANSETRON 4 MG PO TBDP: 4.0000 mg | ORAL_TABLET | Freq: Once | ORAL | Status: DC | PRN

## 2017-03-02 NOTE — ED Triage Notes (Signed)
Pt reports R flank pain, abd pain, dysuria and urinary frequency since Friday. Pt has hx of kidney stones and UTI. Pt in NAD.

## 2017-03-03 DIAGNOSIS — N390 Urinary tract infection, site not specified: Secondary | ICD-10-CM | POA: Diagnosis not present

## 2017-03-03 DIAGNOSIS — R7989 Other specified abnormal findings of blood chemistry: Secondary | ICD-10-CM | POA: Diagnosis not present

## 2017-03-03 DIAGNOSIS — R319 Hematuria, unspecified: Secondary | ICD-10-CM | POA: Diagnosis not present

## 2017-03-05 DIAGNOSIS — R7989 Other specified abnormal findings of blood chemistry: Secondary | ICD-10-CM | POA: Diagnosis not present

## 2017-03-11 DIAGNOSIS — N390 Urinary tract infection, site not specified: Secondary | ICD-10-CM | POA: Diagnosis not present

## 2017-03-24 DIAGNOSIS — R82998 Other abnormal findings in urine: Secondary | ICD-10-CM | POA: Diagnosis not present

## 2017-03-24 DIAGNOSIS — N390 Urinary tract infection, site not specified: Secondary | ICD-10-CM | POA: Diagnosis not present

## 2017-03-24 DIAGNOSIS — N760 Acute vaginitis: Secondary | ICD-10-CM | POA: Diagnosis not present

## 2017-03-31 DIAGNOSIS — M439 Deforming dorsopathy, unspecified: Secondary | ICD-10-CM | POA: Diagnosis not present

## 2017-03-31 DIAGNOSIS — M4804 Spinal stenosis, thoracic region: Secondary | ICD-10-CM | POA: Diagnosis not present

## 2017-03-31 DIAGNOSIS — M4802 Spinal stenosis, cervical region: Secondary | ICD-10-CM | POA: Diagnosis not present

## 2017-04-01 ENCOUNTER — Encounter: Payer: Self-pay | Admitting: Family Medicine

## 2017-04-01 ENCOUNTER — Ambulatory Visit (INDEPENDENT_AMBULATORY_CARE_PROVIDER_SITE_OTHER): Payer: BLUE CROSS/BLUE SHIELD | Admitting: Family Medicine

## 2017-04-01 VITALS — BP 114/78 | HR 80 | Temp 98.4°F | Ht 65.0 in | Wt 191.4 lb

## 2017-04-01 DIAGNOSIS — E892 Postprocedural hypoparathyroidism: Secondary | ICD-10-CM | POA: Diagnosis not present

## 2017-04-01 DIAGNOSIS — M546 Pain in thoracic spine: Secondary | ICD-10-CM

## 2017-04-01 DIAGNOSIS — E890 Postprocedural hypothyroidism: Secondary | ICD-10-CM | POA: Diagnosis not present

## 2017-04-01 NOTE — Progress Notes (Signed)
[name] is a 46 y.o. female is here to [name].   Patient Care Team: Shannon Deutscher, DO as PCP - General (Family Medicine)   History of Present Illness:   HPI:   1. Postsurgical hypoparathyroidism ([place]). Followed by Endocrine. On vitamin D.     2. Postoperative hypothyroidism. Followed by Endocrine. On Armour Thyroid.     3. Acute left-sided thoracic swelling. Her daughter noticed it the other day. Somewhat ttp. No redness or drainage. No rash. No burning or itching. No trauma or overuse. No Hx of the same.     Endocrine ROS: negative for - hair pattern changes, malaise/lethargy, mood swings, palpitations, polydipsia/polyuria, skin changes, temperature intolerance or unexpected weight changes. Cardiovascular ROS: no chest pain or dyspnea on exertion.  Health Maintenance Due  Topic Date Due  . HIV Screening  05/13/1986  . TETANUS/TDAP  05/13/1990   Depression screen PHQ 2/9 04/01/2017  Decreased Interest 0  Down, Depressed, Hopeless 0  PHQ - 2 Score 0   PMHx, SurgHx, SocialHx, Medications, and Allergies were reviewed in the Visit Navigator and updated as appropriate.   Past Medical History:  Diagnosis Date  . Akathisia   . Cancer ([name])   . Endometriosis   . Hypocalcemia   . Hypoparathyroidism ([name])   . Hypothyroidism   . Kidney stones   . Latex allergy   . Palpitations   . Thyroid disease    no thyroid or parathyroid, being followed by [name] at [name])    Past Surgical History:  Procedure Laterality Date  . ABDOMINAL SURGERY    . CESAREAN SECTION  2001  . NM MYOCAR PERF WALL MOTION  07/09/2010   protocol: Bruce, normal prefusion, no evidece of ischemia, exercise capacity 10METS  . OTHER SURGICAL HISTORY     Laparoscopic surgery for endometriosis  . THYROIDECTOMY      Family History  Problem Relation Age of Onset  . Adopted: Yes  . Food Allergy Daughter        Tree Nut Allergy   Social History  Substance Use  Topics  . Smoking status: Never Smoker  . Smokeless tobacco: Never Used  . Alcohol use No   Current Medications and Allergies:   .  calcitRIOL (ROCALTROL) 0.25 MCG capsule, Take 0.25 mcg by mouth 2 (two) times daily. Alternating days of 2 capsules and 1 capsule. .  Calcium Citrate-Vitamin D (CALCIUM CITRATE + D) 315-250 MG-UNIT TABS, Take by mouth 2 (two) times daily., Disp: , Rfl:  .  Cholecalciferol (VITAMIN D3) 1000 units CAPS, Take by mouth 2 (two) times daily., Disp: , Rfl:  .  hydrochlorothiazide (HYDRODIURIL) 12.5 MG tablet, Take 18.75 mg by mouth daily. Take 1.5 tablets once daily., Disp: , Rfl:  .  Magnesium 200 MG TABS, Take 2 tablets by mouth daily. , Disp: , Rfl:  .  thyroid (ARMOUR) 15 MG tablet, Take 15 mg by mouth every other day., Disp: , Rfl:  .  thyroid (ARMOUR) 90 MG tablet, Take 90 mg by mouth daily., Disp: , Rfl:     Allergies  Allergen Reactions  . Compazine [Prochlorperazine Edisylate] Other (See Comments)    Heart rate   . Ciprofloxacin   . Codeine Itching  . Latex   . Metoclopramide Other (See Comments)    akathisia  . Sertraline Other (See Comments)    akathisia  . Benadryl [Diphenhydramine Hcl (Sleep)] Anxiety   Review of Systems:   Pertinent items are noted in the  HPI. Otherwise, ROS is negative.  Vitals:   Vitals:   04/01/17 0952  BP: 114/78  Pulse: 80  Temp: 98.4 F (36.9 C)  TempSrc: Oral  SpO2: 98%  Weight: 191 lb 6.4 oz (86.8 kg)  Height: 5\' 5"  (1.651 m)     Body mass index is 31.85 kg/m.   Physical Exam:   Physical Exam  Constitutional: She appears well-nourished.  HENT:  Head: Normocephalic and atraumatic.  Eyes: Pupils are equal, round, and reactive to light. EOM are normal.  Neck: Normal range of motion. Neck supple.  Cardiovascular: Normal rate, regular rhythm, normal heart sounds and intact distal pulses.   Pulmonary/Chest: Effort normal.  Abdominal: Soft.  Skin: Skin is warm.  Psychiatric: She has a normal mood and  affect. Her behavior is normal.  Nursing note and vitals reviewed.  Assessment and Plan:   [name] was seen today for establish care.  Diagnoses and all orders for this visit:  Postsurgical hypoparathyroidism ([place]) Comments: Followed by Endocrinology.  Postoperative hypothyroidism Comments: Followed by Endocrinology.   Acute left-sided thoracic back pain Comments: Patient's exam c/w periscapular spasms. She admits to Hx of the same. She was previously controlled by acupuncture. SEE AVS.    . Reviewed expectations re: course of current medical issues. . Discussed self-management of symptoms. . Outlined signs and symptoms indicating need for more acute intervention. . Patient verbalized understanding and all questions were answered. Marland Kitchen Health Maintenance issues including appropriate healthy diet, exercise, and smoking avoidance were discussed with patient. . See orders for this visit as documented in the electronic medical record. . Patient received an After Visit Summary.  Shannon Deutscher, DO [name], [name] 04/03/2017

## 2017-04-01 NOTE — Patient Instructions (Addendum)
Fibromyalgia & Chronic Pain [place]:  [name] - [phone]  Recommended Chiropractor:    Dr. Celene Kras, [facility] - [phone]  Our Sport's Med Doctor (OM[facility]):  Dr. Paulla Fore - Call our office - [phone]  Our Physical Therapist ([place]):  Colletta [place] - Call our office - [phone]

## 2017-05-03 ENCOUNTER — Ambulatory Visit (INDEPENDENT_AMBULATORY_CARE_PROVIDER_SITE_OTHER): Payer: BLUE CROSS/BLUE SHIELD | Admitting: Family Medicine

## 2017-05-03 ENCOUNTER — Ambulatory Visit: Payer: BLUE CROSS/BLUE SHIELD | Admitting: Family Medicine

## 2017-05-03 ENCOUNTER — Encounter: Payer: Self-pay | Admitting: Family Medicine

## 2017-05-03 VITALS — BP 112/78 | HR 82 | Temp 98.6°F | Wt 194.2 lb

## 2017-05-03 DIAGNOSIS — R82994 Hypercalciuria: Secondary | ICD-10-CM | POA: Diagnosis not present

## 2017-05-03 DIAGNOSIS — C73 Malignant neoplasm of thyroid gland: Secondary | ICD-10-CM | POA: Diagnosis not present

## 2017-05-03 DIAGNOSIS — E892 Postprocedural hypoparathyroidism: Secondary | ICD-10-CM | POA: Diagnosis not present

## 2017-05-03 LAB — TSH: TSH: 0.06 u[IU]/mL — ABNORMAL LOW (ref 0.35–4.50)

## 2017-05-03 LAB — T4, FREE: Free T4: 0.79 ng/dL (ref 0.60–1.60)

## 2017-05-03 NOTE — Progress Notes (Signed)
[name] is a 46 y.o. female is here for follow up.  History of Present Illness:   HPI: See Assessment and Plan section for Problem Based Charting of issues discussed today.  There are no preventive care reminders to display for this patient. Depression screen PHQ 2/9 04/01/2017  Decreased Interest 0  Down, Depressed, Hopeless 0  PHQ - 2 Score 0   PMHx, SurgHx, SocialHx, FamHx, Medications, and Allergies were reviewed in the Visit Navigator and updated as appropriate.   Patient Active Problem List   Diagnosis Date Noted  . Postsurgical hypoparathyroidism ([place]) 02/18/2017  . Postoperative hypothyroidism 02/18/2017  . Papillary thyroid carcinoma ([place]) 05/09/2014  . Decreased urine volume 08/14/2013  . Hypercalciuria 08/14/2013  . Nephrolithiasis 05/23/2013  . Right knee pain 05/23/2013   Social History   Tobacco Use  . Smoking status: Never Smoker  . Smokeless tobacco: Never Used  Substance Use Topics  . Alcohol use: No  . Drug use: No   Current Medications and Allergies:   .  calcitRIOL (ROCALTROL) 0.25 MCG capsule, Take 0.25 mcg by mouth 2 (two) times daily. Alternating days of 2 capsules and 1 capsule., Disp: , Rfl:  .  Calcium Citrate-Vitamin D (CALCIUM CITRATE + D) 315-250 MG-UNIT TABS, Take by mouth 2 (two) times daily., Disp: , Rfl:  .  Cholecalciferol (VITAMIN D3) 1000 units CAPS, Take by mouth 2 (two) times daily., Disp: , Rfl:  .  hydrochlorothiazide (HYDRODIURIL) 12.5 MG tablet, Take 18.75 mg by mouth daily. Take 1.5 tablets once daily., Disp: , Rfl:  .  Magnesium 200 MG TABS, Take 2 tablets by mouth daily. , Disp: , Rfl:  .  thyroid (ARMOUR) 15 MG tablet, Take 15 mg by mouth every other day., Disp: , Rfl:  .  thyroid (ARMOUR) 90 MG tablet, Take 90 mg by mouth daily., Disp: , Rfl:    Allergies  Allergen Reactions  . Compazine [Prochlorperazine Edisylate] Other (See Comments)    Heart rate   . Ciprofloxacin   . Codeine Itching  . Latex   .  Metoclopramide Other (See Comments)    akathisia  . Sertraline Other (See Comments)    akathisia  . Benadryl [Diphenhydramine Hcl (Sleep)] Anxiety   Review of Systems   Pertinent items are noted in the HPI. Otherwise, ROS is negative.  Vitals:   Vitals:   05/03/17 1338  BP: 112/78  Pulse: 82  Temp: 98.6 F (37 C)  TempSrc: Oral  SpO2: 98%  Weight: 194 lb 3.2 oz (88.1 kg)     Body mass index is 32.32 kg/m.   Physical Exam:   Physical Exam  Constitutional: She appears well-nourished.  HENT:  Head: Normocephalic and atraumatic.  Eyes: EOM are normal. Pupils are equal, round, and reactive to light.  Neck: Normal range of motion. Neck supple.  Cardiovascular: Normal rate, regular rhythm, normal heart sounds and intact distal pulses.  Pulmonary/Chest: Effort normal.  Abdominal: Soft.  Skin: Skin is warm.  Psychiatric: She has a normal mood and affect. Her behavior is normal.  Nursing note and vitals reviewed.   Assessment and Plan:   [name] was seen today for follow-up.  Diagnoses and all orders for this visit:  Postsurgical hypoparathyroidism ([place]) Comments: Changed to Armour Thyroid 90 mg tablet (from another version), 6 weeks ago. Due for lab check. Endocrine ROS: negative for - breast changes, hair pattern changes, hot flashes, malaise/lethargy, mood swings, palpitations, polydipsia/polyuria, skin changes, temperature intolerance or unexpected weight changes. Orders: -  TSH -     T4, free -     T3, reverse  Hypercalciuria Comments: Hx of the same. Labs pending.  Orders: -     Comprehensive metabolic panel -     PTH, Intact and Calcium   . Reviewed expectations re: course of current medical issues. . Discussed self-management of symptoms. . Outlined signs and symptoms indicating need for more acute intervention. . Patient verbalized understanding and all questions were answered. Marland Kitchen Health Maintenance issues including appropriate healthy diet,  exercise, and smoking avoidance were discussed with patient. . See orders for this visit as documented in the electronic medical record. . Patient received an After Visit Summary.  Briscoe Deutscher, DO [place] 05/03/2017  No future appointments.

## 2017-05-04 LAB — COMPREHENSIVE METABOLIC PANEL
ALT: 24 U/L (ref 0–35)
AST: 16 U/L (ref 0–37)
Albumin: 4.4 g/dL (ref 3.5–5.2)
Alkaline Phosphatase: 85 U/L (ref 39–117)
BUN: 18 mg/dL (ref 6–23)
CO2: 24 mEq/L (ref 19–32)
Calcium: 9.2 mg/dL (ref 8.4–10.5)
Chloride: 103 mEq/L (ref 96–112)
Creatinine, Ser: 0.73 mg/dL (ref 0.40–1.20)
GFR: 91.23 mL/min (ref >60.00)
Glucose, Bld: 90 mg/dL (ref 70–99)
Potassium: 3.8 mEq/L (ref 3.5–5.1)
Sodium: 139 mEq/L (ref 135–145)
Total Bilirubin: 0.4 mg/dL (ref 0.2–1.2)
Total Protein: 6.9 g/dL (ref 6.0–8.3)

## 2017-05-06 LAB — PTH, INTACT AND CALCIUM
Calcium: 9.2 mg/dL (ref 8.6–10.2)
PTH: 16 pg/mL (ref 14–64)

## 2017-05-06 LAB — T3, REVERSE: T3, Reverse: 20 ng/dL (ref 8–25)

## 2017-06-15 ENCOUNTER — Encounter: Payer: Self-pay | Admitting: Family Medicine

## 2017-06-15 ENCOUNTER — Ambulatory Visit: Payer: BLUE CROSS/BLUE SHIELD | Admitting: Family Medicine

## 2017-06-15 VITALS — BP 110/80 | HR 100 | Temp 98.4°F | Wt 190.4 lb

## 2017-06-15 DIAGNOSIS — H66002 Acute suppurative otitis media without spontaneous rupture of ear drum, left ear: Secondary | ICD-10-CM

## 2017-06-15 MED ORDER — AMOXICILLIN 875 MG PO TABS: 875.0000 mg | ORAL_TABLET | Freq: Two times a day (BID) | ORAL | 0 refills | Status: DC

## 2017-06-20 NOTE — Progress Notes (Signed)
[name] is a 47 y.o. female here for an acute visit.  History of Present Illness:   HPI: Ear pain. L > R. Acute onset. Associated with rhinitis, PND. No fever. No treatment.   PMHx, SurgHx, SocialHx, Medications, and Allergies were reviewed in the Visit Navigator and updated as appropriate.  Current Medications:   Current Outpatient Medications:  .  calcitRIOL (ROCALTROL) 0.25 MCG capsule, Take 0.25 mcg by mouth 2 (two) times daily. Alternating days of 2 capsules and 1 capsule., Disp: , Rfl:  .  Calcium Citrate-Vitamin D (CALCIUM CITRATE + D) 315-250 MG-UNIT TABS, Take by mouth 2 (two) times daily., Disp: , Rfl:  .  Cholecalciferol (VITAMIN D3) 1000 units CAPS, Take by mouth 2 (two) times daily., Disp: , Rfl:  .  hydrochlorothiazide (HYDRODIURIL) 12.5 MG tablet, Take 18.75 mg by mouth daily. Take 1.5 tablets once daily., Disp: , Rfl:  .  Magnesium 200 MG TABS, Take 2 tablets by mouth daily. , Disp: , Rfl:  .  thyroid (ARMOUR) 15 MG tablet, Take 15 mg by mouth every other day., Disp: , Rfl:  .  thyroid (ARMOUR) 90 MG tablet, Take 90 mg by mouth daily., Disp: , Rfl:   Allergies  Allergen Reactions  . Compazine [Prochlorperazine Edisylate] Other (See Comments)    Heart rate   . Ciprofloxacin   . Codeine Itching  . Latex   . Metoclopramide Other (See Comments)    akathisia  . Sertraline Other (See Comments)    akathisia  . Benadryl [Diphenhydramine Hcl (Sleep)] Anxiety   Review of Systems:   Pertinent items are noted in the HPI. Otherwise, ROS is negative.  Vitals:   Vitals:   06/15/17 1603  BP: 110/80  Pulse: 100  Temp: 98.4 F (36.9 C)  TempSrc: Oral  SpO2: 98%  Weight: 190 lb 6.4 oz (86.4 kg)     Body mass index is 31.68 kg/m.  Physical Exam:   Physical Exam  Constitutional: She is oriented to person, place, and time. She appears well-developed and well-nourished. No distress.  HENT:  Head: Normocephalic and atraumatic.  Right Ear: External  ear normal. Tympanic membrane is injected.  Left Ear: External ear normal. Tympanic membrane is injected.  Nose: Nose normal.  Mouth/Throat: Oropharynx is clear and moist.  Eyes: Conjunctivae and EOM are normal. Pupils are equal, round, and reactive to light.  Neck: Normal range of motion. Neck supple. No thyromegaly present.  Cardiovascular: Normal rate, regular rhythm, normal heart sounds and intact distal pulses.  Pulmonary/Chest: Effort normal and breath sounds normal.  Abdominal: Soft. Bowel sounds are normal.  Musculoskeletal: Normal range of motion.  Lymphadenopathy:    She has no cervical adenopathy.  Neurological: She is alert and oriented to person, place, and time.  Skin: Skin is warm and dry. Capillary refill takes less than 2 seconds.  Psychiatric: She has a normal mood and affect. Her behavior is normal.  Nursing note and vitals reviewed.   Assessment and Plan:   1. Acute suppurative otitis media of left ear without spontaneous rupture of tympanic membrane, recurrence not specified Likely viral. Safety net Rx for amoxicillin provided.  - amoxicillin (AMOXIL) 875 MG tablet; Take 1 tablet (875 mg total) by mouth 2 (two) times daily.  Dispense: 20 tablet; Refill: 0   . Reviewed expectations re: course of current medical issues. . Discussed self-management of symptoms. . Outlined signs and symptoms indicating need for more acute intervention. . Patient verbalized understanding and all questions were  answered. Marland Kitchen Health Maintenance issues including appropriate healthy diet, exercise, and smoking avoidance were discussed with patient. . See orders for this visit as documented in the electronic medical record. . Patient received an After Visit Summary.  Briscoe Deutscher, DO [employer], [facility] 06/20/2017

## 2017-08-24 DIAGNOSIS — J351 Hypertrophy of tonsils: Secondary | ICD-10-CM | POA: Diagnosis not present

## 2017-09-16 DIAGNOSIS — N200 Calculus of kidney: Secondary | ICD-10-CM | POA: Diagnosis not present

## 2017-09-20 ENCOUNTER — Ambulatory Visit (INDEPENDENT_AMBULATORY_CARE_PROVIDER_SITE_OTHER): Payer: BLUE CROSS/BLUE SHIELD | Admitting: Family Medicine

## 2017-09-20 ENCOUNTER — Encounter: Payer: Self-pay | Admitting: Family Medicine

## 2017-09-20 VITALS — BP 116/78 | HR 78 | Temp 97.6°F | Ht 65.0 in | Wt 180.6 lb

## 2017-09-20 DIAGNOSIS — R5383 Other fatigue: Secondary | ICD-10-CM | POA: Diagnosis not present

## 2017-09-20 DIAGNOSIS — E890 Postprocedural hypothyroidism: Secondary | ICD-10-CM

## 2017-09-20 DIAGNOSIS — E559 Vitamin D deficiency, unspecified: Secondary | ICD-10-CM

## 2017-09-20 DIAGNOSIS — B078 Other viral warts: Secondary | ICD-10-CM | POA: Diagnosis not present

## 2017-09-20 DIAGNOSIS — Z13220 Encounter for screening for lipoid disorders: Secondary | ICD-10-CM

## 2017-09-20 DIAGNOSIS — E892 Postprocedural hypoparathyroidism: Secondary | ICD-10-CM

## 2017-09-20 DIAGNOSIS — C73 Malignant neoplasm of thyroid gland: Secondary | ICD-10-CM | POA: Diagnosis not present

## 2017-09-20 DIAGNOSIS — Z0000 Encounter for general adult medical examination without abnormal findings: Secondary | ICD-10-CM | POA: Diagnosis not present

## 2017-09-20 DIAGNOSIS — R5381 Other malaise: Secondary | ICD-10-CM

## 2017-09-20 LAB — COMPREHENSIVE METABOLIC PANEL
ALT: 18 U/L (ref 0–35)
AST: 14 U/L (ref 0–37)
Albumin: 4.3 g/dL (ref 3.5–5.2)
Alkaline Phosphatase: 58 U/L (ref 39–117)
BUN: 14 mg/dL (ref 6–23)
CO2: 27 mEq/L (ref 19–32)
Calcium: 9.4 mg/dL (ref 8.4–10.5)
Chloride: 101 mEq/L (ref 96–112)
Creatinine, Ser: 0.73 mg/dL (ref 0.40–1.20)
GFR: 91.08 mL/min (ref >60.00)
Glucose, Bld: 93 mg/dL (ref 70–99)
Potassium: 3.7 mEq/L (ref 3.5–5.1)
Sodium: 139 mEq/L (ref 135–145)
Total Bilirubin: 0.6 mg/dL (ref 0.2–1.2)
Total Protein: 7.2 g/dL (ref 6.0–8.3)

## 2017-09-20 LAB — LIPID PANEL
Cholesterol: 151 mg/dL (ref 0–200)
HDL: 53.5 mg/dL (ref >39.00)
LDL Cholesterol: 83 mg/dL (ref 0–99)
NonHDL: 97.58
Total CHOL/HDL Ratio: 3
Triglycerides: 74 mg/dL (ref 0.0–149.0)
VLDL: 14.8 mg/dL (ref 0.0–40.0)

## 2017-09-20 LAB — CBC
HCT: 40.6 % (ref 36.0–46.0)
Hemoglobin: 13.5 g/dL (ref 12.0–15.0)
MCHC: 33.2 g/dL (ref 30.0–36.0)
MCV: 79 fl (ref 78.0–100.0)
Platelets: 309 10*3/uL (ref 150.0–400.0)
RBC: 5.13 Mil/uL — ABNORMAL HIGH (ref 3.87–5.11)
RDW: 15.3 % (ref 11.5–15.5)
WBC: 6 10*3/uL (ref 4.0–10.5)

## 2017-09-20 LAB — VITAMIN D 25 HYDROXY (VIT D DEFICIENCY, FRACTURES): VITD: 40.13 ng/mL (ref 30.00–100.00)

## 2017-09-20 LAB — FERRITIN: Ferritin: 5.3 ng/mL — ABNORMAL LOW (ref 10.0–291.0)

## 2017-09-20 NOTE — Progress Notes (Signed)
Subjective:    [name] is a 47 y.o. female and is here for a comprehensive physical exam.  There are no preventive care reminders to display for this patient.  Current Outpatient Medications:  .  calcitRIOL (ROCALTROL) 0.25 MCG capsule, Take 0.25 mcg by mouth 2 (two) times daily. Alternating days of 2 capsules and 1 capsule., Disp: , Rfl:  .  Calcium Citrate-Vitamin D (CALCIUM CITRATE + D) 315-250 MG-UNIT TABS, Take by mouth 2 (two) times daily., Disp: , Rfl:  .  Cholecalciferol (VITAMIN D3) 1000 units CAPS, Take by mouth 2 (two) times daily., Disp: , Rfl:  .  Magnesium 200 MG TABS, Take 2 tablets by mouth daily. , Disp: , Rfl:  .  thyroid (ARMOUR) 15 MG tablet, Take 15 mg by mouth every other day., Disp: , Rfl:  .  thyroid (ARMOUR) 90 MG tablet, Take 90 mg by mouth daily., Disp: , Rfl:   PMHx, SurgHx, SocialHx, Medications, and Allergies were reviewed in the Visit Navigator and updated as appropriate.   Past Medical History:  Diagnosis Date  . Akathisia   . Cancer ([place])   . Endometriosis   . Hypocalcemia   . Hypoparathyroidism ([place])   . Hypothyroidism   . Kidney stones   . Latex allergy   . Palpitations   . Thyroid disease    no thyroid or parathyroid, being followed by Dr Edmonia James at [phone])   Past Surgical History:  Procedure Laterality Date  . ABDOMINAL SURGERY    . CESAREAN SECTION  2001  . NM MYOCAR PERF WALL MOTION  07/09/2010   protocol: Bruce, normal prefusion, no evidece of ischemia, exercise capacity 10METS  . OTHER SURGICAL HISTORY     Laparoscopic surgery for endometriosis  . THYROIDECTOMY     Adopted: Yes  Problem Relation Age of Onset  . Food Allergy Daughter        Tree Nut Allergy   Social History   Tobacco Use  . Smoking status: Never Smoker  . Smokeless tobacco: Never Used  Substance Use Topics  . Alcohol use: No  . Drug use: No   Review of Systems:   Pertinent items are noted in the HPI. Otherwise, ROS is  negative.  Objective:   BP 116/78   Pulse 78   Temp 97.6 F (36.4 C) (Oral)   Ht _0  (1.651 m)   Wt 180 lb 9.6 oz (81.9 kg)   LMP 09/05/2017   SpO2 99%   BMI 30.05 kg/m     Wt Readings from Last 3 Encounters:  09/20/17 180 lb 9.6 oz (81.9 kg)  06/15/17 190 lb 6.4 oz (86.4 kg)  05/03/17 194 lb 32 oz (88.1 kg)     Ht Readings from Last 3 Encounters:  09/20/17 _1  (1.651 m)  04/01/17 _2  (1.651 m)  03/02/17 _3  (1.651 m)   General appearance: alert, cooperative and appears stated age. Head: normocephalic, without obvious abnormality, atraumatic. Neck: no adenopathy, supple, symmetrical, trachea midline; thyroid not enlarged, symmetric, no tenderness/mass/nodules. Lungs: clear to auscultation bilaterally. Heart: regular rate and rhythm Abdomen: soft, non-tender; no masses,  no organomegaly. Extremities: extremities normal, atraumatic, no cyanosis or edema. Skin: skin color, texture, turgor normal, wart on left dorsum of foot. Lymph: cervical, supraclavicular, and axillary nodes normal; no abnormal inguinal nodes palpated. Neurologic: grossly normal.  Assessment/Plan:   [name] was seen today for annual exam.  Diagnoses and all orders for this visit:  Routine physical examination  Screening for lipid disorders -     Lipid panel  Common wart Comments: Cryotherapy  Reason: wart Location: left foot, dorsum  Liquid nitrogen was applied using the liquid nitrogen gun without difficulty with an otoscope tip for concentration. Tolerated well without complications.  Postoperative hypothyroidism  Malaise and fatigue -     Comp Met (CMET) -     Ferritin -     CBC  Vitamin D deficiency -     VITAMIN D 25 Hydroxy (Vit-D Deficiency, Fractures)  Postsurgical hypoparathyroidism (HCC)  Papillary thyroid carcinoma (HCC)  Patient Counseling: _0    Nutrition: Stressed importance of moderation in sodium/caffeine intake, saturated fat and cholesterol, caloric  balance, sufficient intake of fresh fruits, vegetables, fiber, calcium, iron, and 1 mg of folate supplement per day (for females capable of pregnancy).  _1    Stressed the importance of regular exercise.   _2    Substance Abuse: Discussed cessation/primary prevention of tobacco, alcohol, or other drug use; driving or other dangerous activities under the influence; availability of treatment for abuse.   _3    Injury prevention: Discussed safety belts, safety helmets, smoke detector, smoking near bedding or upholstery.   _4    Sexuality: Discussed sexually transmitted diseases, partner selection, use of condoms, avoidance of unintended pregnancy  and contraceptive alternatives.  _5    Dental health: Discussed importance of regular tooth brushing, flossing, and dental visits.  _6    Health maintenance and immunizations reviewed. Please refer to Health maintenance section.   Briscoe Deutscher, DO [place]

## 2017-09-23 ENCOUNTER — Telehealth: Payer: Self-pay

## 2017-09-23 NOTE — Telephone Encounter (Signed)
Called [facility] to get last pap report but we were not listed as her PCP so they would not fax without ROI. Do you want me to call her and get her to come by to fill one out? She does not have app until next year.

## 2017-09-24 NOTE — Telephone Encounter (Signed)
Can she call to change her PCP?

## 2017-09-24 NOTE — Telephone Encounter (Signed)
Left message to return call to our office.  

## 2017-09-29 DIAGNOSIS — N200 Calculus of kidney: Secondary | ICD-10-CM | POA: Diagnosis not present

## 2017-10-05 NOTE — Telephone Encounter (Signed)
Called patient she will call office and change [place] to her PCP. Will give it few days and call back for report.

## 2017-10-08 ENCOUNTER — Encounter: Payer: Self-pay | Admitting: Surgical

## 2017-10-28 ENCOUNTER — Encounter: Payer: Self-pay | Admitting: Family Medicine

## 2017-10-28 ENCOUNTER — Ambulatory Visit: Payer: BLUE CROSS/BLUE SHIELD | Admitting: Family Medicine

## 2017-10-28 VITALS — BP 120/74 | HR 83 | Temp 98.6°F | Ht 65.0 in | Wt 181.4 lb

## 2017-10-28 DIAGNOSIS — L299 Pruritus, unspecified: Secondary | ICD-10-CM

## 2017-10-28 DIAGNOSIS — J3489 Other specified disorders of nose and nasal sinuses: Secondary | ICD-10-CM | POA: Diagnosis not present

## 2017-10-28 NOTE — Progress Notes (Signed)
    Subjective:  [name] is a 47 y.o. female who presents today for same-day appointment with a chief complaint of facial itching.   HPI:  Facial Itching, acute problem Started 4-5 days ago. Worsening over that time. Initially had sensation of itching in her throat which then spread to her ears and mouth.  She has had hives in the past.  No obvious precipitating events.  No rashes noted.  No treatments tried.  ROS: Per HPI  Objective:  Physical Exam: BP 120/74 (BP Location: Left Arm, Patient Position: Sitting, Cuff Size: Normal)   Pulse 83   Temp 98.6 F (37 C) (Oral)   Ht 5\' 5"  (1.651 m)   Wt 181 lb 6.4 oz (82.3 kg)   SpO2 98%   BMI 30.19 kg/m   Gen: NAD, resting comfortably HEENT: EAC with xerosis bilaterally.  TMs clear bilaterally.  Oropharynx slightly erythematous without exudate.  Nasal mucosa slightly erythematous with no significant exudate. CV: RRR with no murmurs appreciated Pulm: NWOB, CTAB with no crackles, wheezes, or rhonchi   Assessment/Plan:  Ear pruritus Likely secondary to xerosis.  Advised use of topical emollient 1-2 times daily over the next few days to see if this helps.  She does not have any signs of bacterial infection on exam today.  Discussed reasons to return to care.  Follow-up as needed.  Rhinorrhea Likely secondary to allergies.  Advised patient to use over-the-counter Flonase.  Also recommended use of Allegra or Zyrtec if needed.  No red flag signs or symptoms.  No signs of bacterial infection.  Discussed reasons return to care.  Follow-up as needed.   Algis Greenhouse. Jerline Pain, MD 10/28/2017 1:03 PM

## 2017-10-28 NOTE — Patient Instructions (Signed)
It was very nice to see you today!  You do not have any signs of an infection  The dryness in your ear is due to to dry skin.  Please put a small amount lotion if this helps.  You can also try using Flonase for the next couple of days to help with your throat itching.  If your symptoms worsen or do not improve over the next few days, please let me or Dr. Juleen China know.  Take care, Dr Jerline Pain

## 2017-11-22 ENCOUNTER — Ambulatory Visit: Payer: BLUE CROSS/BLUE SHIELD | Admitting: Family Medicine

## 2017-11-22 ENCOUNTER — Encounter: Payer: Self-pay | Admitting: Family Medicine

## 2017-11-22 VITALS — BP 108/80 | Temp 98.1°F | Ht 65.0 in | Wt 181.0 lb

## 2017-11-22 DIAGNOSIS — J0100 Acute maxillary sinusitis, unspecified: Secondary | ICD-10-CM | POA: Diagnosis not present

## 2017-11-22 MED ORDER — AMOXICILLIN-POT CLAVULANATE 875-125 MG PO TABS: 1.0000 | ORAL_TABLET | Freq: Two times a day (BID) | ORAL | 0 refills | Status: DC

## 2017-11-22 NOTE — Progress Notes (Addendum)
Patient: [name] MRN: [id number] DOB: [birth year] PCP: Briscoe Deutscher, DO     Subjective:  Chief Complaint  Patient presents with  . poss sinus infection    symptoms x 4 days    HPI: The patient is a 47 y.o. female who presents today for sinus symptoms for about 4 days. She states it felt like a chest cold with tightness in chest and some shortness of breath. She states this started to get better, but then a head cold came in. She started to run fever to 100.8 last night. She has bad nasal congestion and sinus pain and pressure. She has a headache and her teeth hurt. Her last motrin was last night. Her daughter had a little cold, but is getting better. She was also around someone with bronchitis. She is not a smoker and does not have asthma/copd. She does have an inhaler, but has not needed to use it. She also has a lot of allergies so doesn't use much otc medication. She has a nasal spray that she has been using and an essential oil.   Review of Systems  Constitutional: Positive for fatigue. Negative for chills.  HENT: Positive for congestion, sinus pressure and sore throat. Negative for ear pain.   Respiratory: Positive for cough and chest tightness. Negative for shortness of breath.   Cardiovascular: Negative for chest pain and leg swelling.  Gastrointestinal: Positive for nausea. Negative for diarrhea and vomiting.  Genitourinary: Negative for dysuria.  Musculoskeletal: Negative for back pain.  Neurological: Positive for headaches. Negative for dizziness.  Psychiatric/Behavioral: The patient is not nervous/anxious.     Allergies Patient is allergic to compazine [prochlorperazine edisylate]; ciprofloxacin; codeine; latex; metoclopramide; sertraline; and benadryl [diphenhydramine hcl (sleep)].  Past Medical History Patient  has a past medical history of Akathisia, Cancer ([place]), Endometriosis, Hypocalcemia, Hypoparathyroidism ([place]), Hypothyroidism, Kidney stones, Latex  allergy, Palpitations, and Thyroid disease.  Surgical History Patient  has a past surgical history that includes Cesarean section (2001); Abdominal surgery; Thyroidectomy; NM MYOCAR PERF WALL MOTION (07/09/2010); and OTHER SURGICAL HISTORY.  Family History Pateint's family history includes Food Allergy in her daughter. She was adopted.  Social History Patient  reports that she has never smoked. She has never used smokeless tobacco. She reports that she does not drink alcohol or use drugs.    Objective: Vitals:   11/22/17 1355  BP: 108/80  Temp: 98.1 F (36.7 C)  TempSrc: Oral  SpO2: 97%  Weight: 181 lb (82.1 kg)  Height: 5\' 5"  (1.651 m)    Body mass index is 30.12 kg/m.  Physical Exam  Constitutional: She appears well-developed and well-nourished.  HENT:  Right Ear: External ear normal.  Left Ear: External ear normal.  Mouth/Throat: Oropharynx is clear and moist. No oropharyngeal exudate.  Right nasal turbinate with 2+ edema. Maxillary sinus tenderness bilaterally   Eyes: Conjunctivae are normal.  Neck: Normal range of motion. Neck supple.  Cardiovascular: Normal rate, regular rhythm and normal heart sounds.  Pulmonary/Chest: Effort normal and breath sounds normal. She has no wheezes. She has no rales.  Abdominal: Soft. Bowel sounds are normal.  Lymphadenopathy:    She has no cervical adenopathy.  Vitals reviewed.      Assessment/plan: 1. Acute maxillary sinusitis, recurrence not specified 10 day course of augmentin, flonase, cool mist humidifier. Recommended pro-biotics. Side effects of medication discussed. She does not want a steroid shot, but discussed if pressure/pain does not get better with the above treatment she can come in for steroid  shot if needed. Let us know if not getting better.    Return if symptoms worsen or fail to improve.    Orma Flaming, MD [place]   11/22/2017

## 2017-11-22 NOTE — Patient Instructions (Signed)

## 2017-11-24 ENCOUNTER — Ambulatory Visit: Payer: BLUE CROSS/BLUE SHIELD | Admitting: Family Medicine

## 2017-11-24 VITALS — BP 110/80 | HR 86 | Temp 98.2°F | Ht 65.0 in | Wt 181.4 lb

## 2017-11-24 DIAGNOSIS — R509 Fever, unspecified: Secondary | ICD-10-CM | POA: Diagnosis not present

## 2017-11-24 DIAGNOSIS — B2780 Other infectious mononucleosis without complication: Secondary | ICD-10-CM

## 2017-11-24 DIAGNOSIS — B349 Viral infection, unspecified: Secondary | ICD-10-CM

## 2017-11-24 LAB — CBC WITH DIFFERENTIAL/PLATELET
Basophils Absolute: 0.1 10*3/uL (ref 0.0–0.1)
Basophils Relative: 1.4 % (ref 0.0–3.0)
Eosinophils Absolute: 0.2 10*3/uL (ref 0.0–0.7)
Eosinophils Relative: 2.5 % (ref 0.0–5.0)
HCT: 40.3 % (ref 36.0–46.0)
Hemoglobin: 13.5 g/dL (ref 12.0–15.0)
Lymphocytes Relative: 24.4 % (ref 12.0–46.0)
Lymphs Abs: 1.5 10*3/uL (ref 0.7–4.0)
MCHC: 33.5 g/dL (ref 30.0–36.0)
MCV: 79.4 fl (ref 78.0–100.0)
Monocytes Absolute: 0.6 10*3/uL (ref 0.1–1.0)
Monocytes Relative: 10.3 % (ref 3.0–12.0)
Neutro Abs: 3.7 10*3/uL (ref 1.4–7.7)
Neutrophils Relative %: 61.4 % (ref 43.0–77.0)
Platelets: 304 10*3/uL (ref 150.0–400.0)
RBC: 5.08 Mil/uL (ref 3.87–5.11)
RDW: 15 % (ref 11.5–15.5)
WBC: 6.1 10*3/uL (ref 4.0–10.5)

## 2017-11-24 LAB — URINALYSIS, ROUTINE W REFLEX MICROSCOPIC
Bilirubin Urine: NEGATIVE
Ketones, ur: NEGATIVE
Leukocytes, UA: NEGATIVE
Nitrite: NEGATIVE
Specific Gravity, Urine: 1.01 (ref 1.000–1.030)
Total Protein, Urine: NEGATIVE
Urine Glucose: NEGATIVE
Urobilinogen, UA: 0.2 (ref 0.0–1.0)
pH: 7.5 (ref 5.0–8.0)

## 2017-11-24 LAB — COMPREHENSIVE METABOLIC PANEL
ALT: 24 U/L (ref 0–35)
AST: 20 U/L (ref 0–37)
Albumin: 4.1 g/dL (ref 3.5–5.2)
Alkaline Phosphatase: 88 U/L (ref 39–117)
BUN: 14 mg/dL (ref 6–23)
CO2: 32 mEq/L (ref 19–32)
Calcium: 9 mg/dL (ref 8.4–10.5)
Chloride: 99 mEq/L (ref 96–112)
Creatinine, Ser: 0.67 mg/dL (ref 0.40–1.20)
GFR: 100.48 mL/min (ref >60.00)
Glucose, Bld: 99 mg/dL (ref 70–99)
Potassium: 3.7 mEq/L (ref 3.5–5.1)
Sodium: 136 mEq/L (ref 135–145)
Total Bilirubin: 0.3 mg/dL (ref 0.2–1.2)
Total Protein: 7.2 g/dL (ref 6.0–8.3)

## 2017-11-24 LAB — POCT MONO (EPSTEIN BARR VIRUS): Mono, POC: NEGATIVE

## 2017-11-24 NOTE — Progress Notes (Signed)
[name] is a 47 y.o. female is here for follow up.  History of Present Illness:   [name], CMA acting as scribe for Dr. Briscoe Deutscher.   HPI: Patient in for follow up. She was seen by Dr. Rogers Hunt on Monday. Started running high fever for about two days after. She is having dark urine with flank pain. Very concerned about kidneys and liver/gallbladder. Hx of cholestasis of pregnancy. Hx of mono as young adult. Nonsmoker. Immunizations are up to date.   There are no preventive care reminders to display for this patient.   Depression screen PHQ 2/9 04/01/2017  Decreased Interest 0  Down, Depressed, Hopeless 0  PHQ - 2 Score 0   PMHx, SurgHx, SocialHx, FamHx, Medications, and Allergies were reviewed in the Visit Navigator and updated as appropriate.   Patient Active Problem List   Diagnosis Date Noted  . Postsurgical hypoparathyroidism ([place]) 02/18/2017  . Postoperative hypothyroidism 02/18/2017  . Papillary thyroid carcinoma ([place]) 05/09/2014  . Decreased urine volume 08/14/2013  . Hypercalciuria 08/14/2013  . Nephrolithiasis 05/23/2013  . Right knee pain 05/23/2013   Social History   Tobacco Use  . Smoking status: Never Smoker  . Smokeless tobacco: Never Used  Substance Use Topics  . Alcohol use: No  . Drug use: No   Current Medications and Allergies:   Current Outpatient Medications:  .  amoxicillin-clavulanate (AUGMENTIN) 875-125 MG tablet, Take 1 tablet by mouth 2 (two) times daily., Disp: 20 tablet, Rfl: 0 .  calcitRIOL (ROCALTROL) 0.25 MCG capsule, Take 0.25 mcg by mouth 2 (two) times daily. Alternating days of 2 capsules and 1 capsule., Disp: , Rfl:  .  Calcium Citrate-Vitamin D (CALCIUM CITRATE + D) 315-250 MG-UNIT TABS, Take by mouth 2 (two) times daily., Disp: , Rfl:  .  Cholecalciferol (VITAMIN D3) 1000 units CAPS, Take by mouth daily. , Disp: , Rfl:  .  hydrochlorothiazide (MICROZIDE) 12.5 MG capsule, Take 12.5 mg by mouth daily., Disp: , Rfl:   .  Magnesium 200 MG TABS, Take 2 tablets by mouth daily. , Disp: , Rfl:  .  thyroid (ARMOUR) 15 MG tablet, Take 15 mg by mouth every other day., Disp: , Rfl:  .  thyroid (ARMOUR) 90 MG tablet, Take 90 mg by mouth daily., Disp: , Rfl:    Allergies  Allergen Reactions  . Compazine [Prochlorperazine Edisylate] Other (See Comments)    Heart rate   . Ciprofloxacin   . Codeine Itching  . Latex   . Metoclopramide Other (See Comments)    akathisia  . Sertraline Other (See Comments)    akathisia  . Benadryl [Diphenhydramine Hcl (Sleep)] Anxiety   Review of Systems   Pertinent items are noted in the HPI. Otherwise, ROS is negative.  Vitals:   Vitals:   11/24/17 1438  BP: 110/80  Pulse: 86  Temp: 98.2 F (36.8 C)  TempSrc: Oral  SpO2: 97%  Weight: 181 lb 6.4 oz (82.3 kg)  Height: 5\' 5"  (1.651 m)     Body mass index is 30.19 kg/m.  Physical Exam:   Physical Exam  Constitutional: She appears well-nourished.  HENT:  Head: Normocephalic and atraumatic.  Mouth/Throat: Posterior oropharyngeal erythema present.  Eyes: Pupils are equal, round, and reactive to light. EOM are normal.  Neck: Normal range of motion. Neck supple.  Cardiovascular: Normal rate, regular rhythm, normal heart sounds and intact distal pulses.  Pulmonary/Chest: Effort normal.  Abdominal: Soft.  Skin: Skin is warm.  Psychiatric: She has a  normal mood and affect. Her behavior is normal.  Nursing note and vitals reviewed.  Results for orders placed or performed in visit on 11/24/17  CBC with Differential/Platelet  Result Value Ref Range   WBC 6.1 4.0 - 10.5 K/uL   RBC 5.08 3.87 - 5.11 Mil/uL   Hemoglobin 13.5 12.0 - 15.0 g/dL   HCT 40.3 36.0 - 46.0 %   MCV 79.4 78.0 - 100.0 fl   MCHC 33.5 30.0 - 36.0 g/dL   RDW 15.0 11.5 - 15.5 %   Platelets 304.0 150.0 - 400.0 K/uL   Neutrophils Relative % 61.4 43.0 - 77.0 %   Lymphocytes Relative 24.4 12.0 - 46.0 %   Monocytes Relative 10.3 3.0 - 12.0 %    Eosinophils Relative 2.5 0.0 - 5.0 %   Basophils Relative 1.4 0.0 - 3.0 %   Neutro Abs 3.7 1.4 - 7.7 K/uL   Lymphs Abs 1.5 0.7 - 4.0 K/uL   Monocytes Absolute 0.6 0.1 - 1.0 K/uL   Eosinophils Absolute 0.2 0.0 - 0.7 K/uL   Basophils Absolute 0.1 0.0 - 0.1 K/uL  Comprehensive metabolic panel  Result Value Ref Range   Sodium 136 135 - 145 mEq/L   Potassium 3.7 3.5 - 5.1 mEq/L   Chloride 99 96 - 112 mEq/L   CO2 32 19 - 32 mEq/L   Glucose, Bld 99 70 - 99 mg/dL   BUN 14 6 - 23 mg/dL   Creatinine, Ser 0.67 0.40 - 1.20 mg/dL   Total Bilirubin 0.3 0.2 - 1.2 mg/dL   Alkaline Phosphatase 88 39 - 117 U/L   AST 20 0 - 37 U/L   ALT 24 0 - 35 U/L   Total Protein 7.2 6.0 - 8.3 g/dL   Albumin 4.1 3.5 - 5.2 g/dL   Calcium 9.0 8.4 - 10.5 mg/dL   GFR 100.48 >60.00 mL/min  Urinalysis, Routine w reflex microscopic  Result Value Ref Range   Color, Urine YELLOW Yellow;Lt. Yellow   APPearance CLEAR Clear   Specific Gravity, Urine 1.010 1.000 - 1.030   pH 7.5 5.0 - 8.0   Total Protein, Urine NEGATIVE Negative   Urine Glucose NEGATIVE Negative   Ketones, ur NEGATIVE Negative   Bilirubin Urine NEGATIVE Negative   Hgb urine dipstick SMALL (A) Negative   Urobilinogen, UA 0.2 0.0 - 1.0   Leukocytes, UA NEGATIVE Negative   Nitrite NEGATIVE Negative   WBC, UA 3-6/hpf (A) 0-2/hpf   RBC / HPF 0-2/hpf 0-2/hpf   Squamous Epithelial / LPF Rare(0-4/hpf) Rare(0-4/hpf)  POCT Mono (Epstein Barr Virus)  Result Value Ref Range   Mono, POC Negative Negative   Assessment and Plan:   [name] was seen today for follow-up.  Diagnoses and all orders for this visit:  Fever, unspecified fever cause -     CBC with Differential/Platelet -     Comprehensive metabolic panel -     Urine Culture -     Urinalysis, Routine w reflex microscopic -     POCT Mono (Epstein Barr Virus)  Infectious mononucleosis-like syndrome Comments: Syndrome s/w viral infection today. No need for Abx but patient wants to continue.  Stressed hydration. Keep fever down. Will recheck by phone in 2 days.     . Reviewed expectations re: course of current medical issues. . Discussed self-management of symptoms. . Outlined signs and symptoms indicating need for more acute intervention. . Patient verbalized understanding and all questions were answered. Marland Kitchen Health Maintenance issues including appropriate healthy diet, exercise,  and smoking avoidance were discussed with patient. . See orders for this visit as documented in the electronic medical record. . Patient received an After Visit Summary.  Briscoe Deutscher, DO [place], [facility] 11/25/2017  Future Appointments  Date Time Provider [place]  09/23/2018  9:00 AM Briscoe Deutscher, DO [facility]    CMA served as scribe during this visit. History, Physical, and Plan performed by medical provider. The above documentation has been reviewed and is accurate and complete. Briscoe Deutscher, D.O.

## 2017-11-25 ENCOUNTER — Encounter: Payer: Self-pay | Admitting: Family Medicine

## 2017-11-25 ENCOUNTER — Telehealth: Payer: Self-pay | Admitting: Family Medicine

## 2017-11-25 LAB — URINE CULTURE
MICRO NUMBER:: 90763772
Result:: NO GROWTH
SPECIMEN QUALITY:: ADEQUATE

## 2017-11-25 NOTE — Telephone Encounter (Signed)
Spoke with patient, reviewed lab results and Dr Mick Sell suggestion to stop taking antibiotics. Patient states that she is starting to feel better, fever is gone, able to walk around more. I advised patient to call office right away is she starts to feel worse. Patient stated understanding.

## 2017-11-25 NOTE — Telephone Encounter (Signed)
Copied from [place] [phone]. Topic: Quick Communication - Rx Refill/Question >> Nov 25, 2017  1:32 PM Boyd Kerbs wrote: Medication:  amoxicillin-clavulanate (AUGMENTIN) 875-125 MG tablet Pt was told by Dr. Juleen China to stop this until gets lab results back.  She did get lab results, but was not sure if she should start taking her antibiotic again.  She has missed 2 doses now When she does too much she gets dizzy (trying to take it easy) she did not have fever last night.

## 2017-11-25 NOTE — Telephone Encounter (Signed)
Cassie was able to speak with the patient. Forwarding to her to document.

## 2017-11-26 ENCOUNTER — Ambulatory Visit: Payer: BLUE CROSS/BLUE SHIELD | Admitting: Family Medicine

## 2017-12-02 NOTE — Progress Notes (Signed)
[name] is a 47 y.o. female is here for follow up.  History of Present Illness:  Water quality scientist, CMA acting as scribe for Briscoe Deutscher, DO  HPI:  Patient states she is feeling better since being diagnosed with a virus recently.  She is still having some fatigue, but is feeling much better.  She would like to have her thyroid labs checked today because she was so ill and is worried her levels may have been thrown off.  She says she doesn't think she has ever been that sick in her entire adult life.  States she was in bed for 8 days and had a fever for 3-4 of those days. She wonders if she may have been bitten by a tick.   There are no preventive care reminders to display for this patient. Depression screen PHQ 2/9 04/01/2017  Decreased Interest 0  Down, Depressed, Hopeless 0  PHQ - 2 Score 0   PMHx, SurgHx, SocialHx, FamHx, Medications, and Allergies were reviewed in the Visit Navigator and updated as appropriate.   Patient Active Problem List   Diagnosis Date Noted  . Postsurgical hypoparathyroidism ([place]) 02/18/2017  . Postoperative hypothyroidism 02/18/2017  . Papillary thyroid carcinoma ([place]) 05/09/2014  . Decreased urine volume 08/14/2013  . Hypercalciuria 08/14/2013  . Nephrolithiasis 05/23/2013  . Right knee pain 05/23/2013   Social History   Tobacco Use  . Smoking status: Never Smoker  . Smokeless tobacco: Never Used  Substance Use Topics  . Alcohol use: No  . Drug use: No   Current Medications and Allergies:   Current Outpatient Medications:  .  calcitRIOL (ROCALTROL) 0.25 MCG capsule, Take 0.25 mcg by mouth 2 (two) times daily. Alternating days of 2 capsules and 1 capsule., Disp: , Rfl:  .  Calcium Citrate-Vitamin D (CALCIUM CITRATE + D) 315-250 MG-UNIT TABS, Take by mouth 2 (two) times daily., Disp: , Rfl:  .  Cholecalciferol (VITAMIN D3) 1000 units CAPS, Take by mouth daily. , Disp: , Rfl:  .  hydrochlorothiazide (MICROZIDE) 12.5 MG capsule, Take 12.5  mg by mouth daily., Disp: , Rfl:  .  Magnesium 200 MG TABS, Take 2 tablets by mouth daily. , Disp: , Rfl:  .  thyroid (ARMOUR) 15 MG tablet, Take 15 mg by mouth every other day., Disp: , Rfl:  .  thyroid (ARMOUR) 90 MG tablet, Take 90 mg by mouth daily., Disp: , Rfl:  .  doxycycline (VIBRA-TABS) 100 MG tablet, Take 1 tablet (100 mg total) by mouth 2 (two) times daily., Disp: 20 tablet, Rfl: 0   Allergies  Allergen Reactions  . Compazine [Prochlorperazine Edisylate] Other (See Comments)    Heart rate   . Ciprofloxacin   . Codeine Itching  . Latex   . Metoclopramide Other (See Comments)    akathisia  . Sertraline Other (See Comments)    akathisia  . Benadryl [Diphenhydramine Hcl (Sleep)] Anxiety   Review of Systems   Pertinent items are noted in the HPI. Otherwise, ROS is negative.  Vitals:   Vitals:   12/03/17 1140  BP: 114/74  Pulse: 79  Temp: 98.2 F (36.8 C)  TempSrc: Oral  SpO2: 98%  Weight: 183 lb 12.8 oz (83.4 kg)  Height: 5\' 5"  (1.651 m)     Body mass index is 30.59 kg/m.  Physical Exam:   Physical Exam  Constitutional: She appears well-nourished.  HENT:  Head: Normocephalic and atraumatic.  Eyes: Pupils are equal, round, and reactive to light. EOM are normal.  Neck: Normal range of motion. Neck supple.  Cardiovascular: Normal rate, regular rhythm, normal heart sounds and intact distal pulses.  Pulmonary/Chest: Effort normal.  Abdominal: Soft.  Skin: Skin is warm.  Psychiatric: She has a normal mood and affect. Her behavior is normal.  Nursing note and vitals reviewed.  Assessment and Plan:   [place] was seen today for follow-up.  Diagnoses and all orders for this visit:  Postoperative hypothyroidism -     CBC with Differential/Platelet -     Comprehensive metabolic panel -     TSH -     T4, free -     Thyroid peroxidase antibody -     B-HCG Quant -     doxycycline (VIBRA-TABS) 100 MG tablet; Take 1 tablet (100 mg total) by mouth 2 (two)  times daily.  Malaise and fatigue -     CBC with Differential/Platelet -     Comprehensive metabolic panel -     TSH -     T4, free -     Thyroid peroxidase antibody -     B-HCG Quant -     doxycycline (VIBRA-TABS) 100 MG tablet; Take 1 tablet (100 mg total) by mouth 2 (two) times daily.    . Reviewed expectations re: course of current medical issues. . Discussed self-management of symptoms. . Outlined signs and symptoms indicating need for more acute intervention. . Patient verbalized understanding and all questions were answered. Marland Kitchen Health Maintenance issues including appropriate healthy diet, exercise, and smoking avoidance were discussed with patient. . See orders for this visit as documented in the electronic medical record. . Patient received an After Visit Summary.  Briscoe Deutscher, DO [place], [facility] 12/03/2017  Future Appointments  Date Time Provider [place]  09/23/2018  9:00 AM Briscoe Deutscher, DO [facility]   CMA served as scribe during this visit. History, Physical, and Plan performed by medical provider. The above documentation has been reviewed and is accurate and complete. Briscoe Deutscher, D.O.

## 2017-12-03 ENCOUNTER — Ambulatory Visit: Payer: BLUE CROSS/BLUE SHIELD | Admitting: Family Medicine

## 2017-12-03 ENCOUNTER — Encounter: Payer: Self-pay | Admitting: Family Medicine

## 2017-12-03 VITALS — BP 114/74 | HR 79 | Temp 98.2°F | Ht 65.0 in | Wt 183.8 lb

## 2017-12-03 DIAGNOSIS — E890 Postprocedural hypothyroidism: Secondary | ICD-10-CM | POA: Diagnosis not present

## 2017-12-03 DIAGNOSIS — R5383 Other fatigue: Secondary | ICD-10-CM | POA: Diagnosis not present

## 2017-12-03 DIAGNOSIS — R5381 Other malaise: Secondary | ICD-10-CM | POA: Diagnosis not present

## 2017-12-03 LAB — COMPREHENSIVE METABOLIC PANEL
ALT: 20 U/L (ref 0–35)
AST: 15 U/L (ref 0–37)
Albumin: 4.1 g/dL (ref 3.5–5.2)
Alkaline Phosphatase: 87 U/L (ref 39–117)
BUN: 14 mg/dL (ref 6–23)
CO2: 28 mEq/L (ref 19–32)
Calcium: 9.2 mg/dL (ref 8.4–10.5)
Chloride: 103 mEq/L (ref 96–112)
Creatinine, Ser: 0.68 mg/dL (ref 0.40–1.20)
GFR: 98.76 mL/min (ref >60.00)
Glucose, Bld: 93 mg/dL (ref 70–99)
Potassium: 4 mEq/L (ref 3.5–5.1)
Sodium: 139 mEq/L (ref 135–145)
Total Bilirubin: 0.4 mg/dL (ref 0.2–1.2)
Total Protein: 7 g/dL (ref 6.0–8.3)

## 2017-12-03 LAB — CBC WITH DIFFERENTIAL/PLATELET
Basophils Absolute: 0.1 10*3/uL (ref 0.0–0.1)
Basophils Relative: 1.2 % (ref 0.0–3.0)
Eosinophils Absolute: 0.1 10*3/uL (ref 0.0–0.7)
Eosinophils Relative: 1.9 % (ref 0.0–5.0)
HCT: 39.1 % (ref 36.0–46.0)
Hemoglobin: 12.9 g/dL (ref 12.0–15.0)
Lymphocytes Relative: 21.8 % (ref 12.0–46.0)
Lymphs Abs: 1.7 10*3/uL (ref 0.7–4.0)
MCHC: 33.1 g/dL (ref 30.0–36.0)
MCV: 80.3 fl (ref 78.0–100.0)
Monocytes Absolute: 0.5 10*3/uL (ref 0.1–1.0)
Monocytes Relative: 6.4 % (ref 3.0–12.0)
Neutro Abs: 5.4 10*3/uL (ref 1.4–7.7)
Neutrophils Relative %: 68.7 % (ref 43.0–77.0)
Platelets: 300 10*3/uL (ref 150.0–400.0)
RBC: 4.87 Mil/uL (ref 3.87–5.11)
RDW: 14.8 % (ref 11.5–15.5)
WBC: 7.9 10*3/uL (ref 4.0–10.5)

## 2017-12-03 LAB — TSH: TSH: 0.03 u[IU]/mL — ABNORMAL LOW (ref 0.35–4.50)

## 2017-12-03 LAB — T4, FREE: Free T4: 0.73 ng/dL (ref 0.60–1.60)

## 2017-12-03 LAB — HCG, QUANTITATIVE, PREGNANCY: Quantitative HCG: 0.16 m[IU]/mL

## 2017-12-03 MED ORDER — DOXYCYCLINE HYCLATE 100 MG PO TABS: 100.0000 mg | ORAL_TABLET | Freq: Two times a day (BID) | ORAL | 0 refills | Status: DC

## 2017-12-06 LAB — THYROID PEROXIDASE ANTIBODY: Thyroperoxidase Ab SerPl-aCnc: 1 IU/mL (ref <9)

## 2017-12-29 DIAGNOSIS — C73 Malignant neoplasm of thyroid gland: Secondary | ICD-10-CM | POA: Diagnosis not present

## 2018-01-20 ENCOUNTER — Ambulatory Visit (INDEPENDENT_AMBULATORY_CARE_PROVIDER_SITE_OTHER): Payer: BLUE CROSS/BLUE SHIELD | Admitting: Sports Medicine

## 2018-01-20 ENCOUNTER — Encounter: Payer: Self-pay | Admitting: Sports Medicine

## 2018-01-20 VITALS — BP 110/82 | HR 67 | Ht 65.0 in | Wt 183.6 lb

## 2018-01-20 DIAGNOSIS — M9907 Segmental and somatic dysfunction of upper extremity: Secondary | ICD-10-CM

## 2018-01-20 DIAGNOSIS — M25511 Pain in right shoulder: Secondary | ICD-10-CM | POA: Diagnosis not present

## 2018-01-20 DIAGNOSIS — M9901 Segmental and somatic dysfunction of cervical region: Secondary | ICD-10-CM | POA: Diagnosis not present

## 2018-01-20 DIAGNOSIS — M9902 Segmental and somatic dysfunction of thoracic region: Secondary | ICD-10-CM

## 2018-01-20 DIAGNOSIS — M9908 Segmental and somatic dysfunction of rib cage: Secondary | ICD-10-CM | POA: Diagnosis not present

## 2018-01-20 DIAGNOSIS — M542 Cervicalgia: Secondary | ICD-10-CM

## 2018-01-20 MED ORDER — DIAZEPAM 5 MG PO TABS: 5.0000 mg | ORAL_TABLET | Freq: Two times a day (BID) | ORAL | 0 refills | Status: DC | PRN

## 2018-01-20 NOTE — Progress Notes (Signed)
PROCEDURE NOTE : OSTEOPATHIC MANIPULATION The decision today to treat with Osteopathic Manipulative Therapy (OMT) was based on physical exam findings. Verbal consent was obtained following a discussion with the patient regarding the of risks, benefits and potential side effects, including an acute pain flare,post manipulation soreness and need for repeat treatments. Additionally, we specifically discussed the minimal risk of  injury to neurovascular structures associated with Cervical manipulation.   Contraindications to OMT: NONE  Manipulation was performed as below: Regions treated: Cervical spine, Thoracic spine, Ribs and Upper extremities OMT Techniques Used: HVLA, muscle energy, myofascial release, articulatory and soft tissue  The patient tolerated the treatment well and reported Improved symptoms following treatment today. Patient was given medications, exercises, stretches and lifestyle modifications per AVS and verbally.   OSTEOPATHIC/STRUCTURAL EXAM:   OA - rotated right C2 FRS right (Flexed, Rotated & Sidebent) C6 ERS left (Extended, Rotated & Sidebent) T4 -8 Neutral, Rotated LEFT, Sidebent RIGHT Rib 2 Right  Posterior Internal rotated right glenohumeral joint Right supraspinatous tenderpoint

## 2018-01-20 NOTE — Patient Instructions (Addendum)
Also check out [employer]" which is a program developed by Dr. Minerva Ends.   There are links to a couple of his YouTube Videos below and I would like to see performing one of his videos 5-6 days per week.    A good intro video is: "Independence from Pain 7-minute Video" - [email or web address]   Exercises that focus more on the neck are as below: Dr. Archie Balboa with [employer] teaching neck and shoulder details Part 1 - https://[email or web address].[email or web address] Part 2 Dr. Archie Balboa with [facility] quick routine to practice daily - https://youtu.be/[email or web address]  Do not try to attempt the entire video when first beginning.    Try breaking of each exercise that he goes into shorter segments.  Otherwise if they perform an exercise for 45 seconds, start with 15 seconds and rest and then resume when they begin the new activity.  If you work your way up to being able to do these videos without having to stop, I expect you will see significant improvements in your pain.  If you enjoy his videos and would like to find out more you can look on his website: [email or web address]  He has a workout streaming option as well as a DVD set available for purchase.  Amazon has the best price for his DVDs.     Please perform the exercise program that we have prepared for you and gone over in detail on a daily basis.  In addition to the handout you were provided you can access your program through: [email or web address]   Your unique program code is:  [email or web address]

## 2018-01-20 NOTE — Progress Notes (Signed)
[name]. [name], [facility] [place]  [name] - 47 y.o. female MRN [id number]  Date of birth: 31-Jul-1970 Visit Date: 01/20/2018  PCP: Briscoe Deutscher, DO   Referred by: Briscoe Deutscher, DO  Scribe(s) for today's visit: Josepha Pigg, CMA  SUBJECTIVE:  [name] "[name]" is here for Initial Assessment (neck pain)   Her neck pain symptoms INITIALLY: Began several years ago. She reports pinched nerve in her neck.  Described as moderate (4-7/10) burning, radiating to R arm. At times the pain radiates into the R thumb. Pain is constant.  Worsened with lying in bed, sitting, any position in a long duration.  Improved with standing, changing positions.  Additional associated symptoms include: She reports night time disturbance, she has trouble lying on her R side.     At this time symptoms are worsening compared to onset, over the past 1 -2 weeks.  She has been seeing a chiropractor ([facility]), saw her the Wednesday before moving her child to college and then again this past Tuesday. She has tried Motrin with no relief. She has tried applying heat with temporary relief. Tiger Balm has provided relief in the past but isn't helping this time.    REVIEW OF SYSTEMS: Reports night time disturbances. Denies fevers, chills, or night sweats. Denies unexplained weight loss. Reports personal history of thyroid cancer. Denies changes in bowel or bladder habits. Denies recent unreported falls. Denies new or worsening dyspnea or wheezing. Denies headaches or dizziness.  Denies numbness, tingling or weakness  In the extremities.  Denies dizziness or presyncopal episodes Denies lower extremity edema     HISTORY & PERTINENT PRIOR DATA:  Significant/pertinent history, findings, studies include:  reports that she has never smoked. She has never used smokeless tobacco. No results for input(s): HGBA1C,  LABURIC, CREATINE in the last 8760 hours. No specialty comments available. Problem  Neck Pain    Otherwise prior history reviewed and updated per electronic medical record.    OBJECTIVE:  VS:  HT:5\' 5"  (165.1 cm)   WT:183 lb 9.6 oz (83.3 kg)  BMI:30.55    BP:110/82  HR:67bpm  TEMP: ( )  RESP:98 %   PHYSICAL EXAM: CONSTITUTIONAL: Well-developed, Well-nourished and In no acute distress Alert & appropriately interactive. and Not depressed or anxious appearing. RESPIRATORY: No increased work of breathing and Trachea Midline EYES: Pupils are equal., EOM intact without nystagmus. and No scleral icterus.  Upper extremities: Warm and well perfused Edema: No significant swelling or edema NEURO: unremarkable Normal associated myotomal distribution strength to manual muscle testing Normal sensation to light touch Normal and symmetric associated DTRs  MSK Exam: Bilateral upper is overall well aligned.  No significant deformity.  She has good internal and external rotation of bilateral arms.  Her cervical range of motion is markedly limited to the right more than the left.  Upper extremity neuro exam is unremarkable.  She does have tightness along the anterior chest wall and has likely some component of thoracic outlet syndrome.  Thoracic and scapular motion is diminished.  Otherwise per osteopathic exam.  Right shoulder has good intrinsic rotator cuff strength.  She does have generalized anterior carriage with scapular dyskinesis.  PROCEDURES & DATA REVIEWED:  Osteopathic manipulation was performed today based on physical exam findings.  Please see procedure note for further information including Osteopathic Exam findings Discussed the foundation of treatment for this condition is physical therapy and/or daily (5-6 days/week) therapeutic exercises,  focusing on core strengthening, coordination, neuromuscular control/reeducation.  Therapeutic exercises prescribed per procedure  note.  ASSESSMENT   1. Acute pain of right shoulder   2. Somatic dysfunction of upper extremity   3. Somatic dysfunction of cervical region   4. Somatic dysfunction of thoracic region   5. Somatic dysfunction of rib cage region   6. Neck pain     PLAN:       . Please see procedure section and notes. . Links to [employer] provided today per Patient Instructions.  These exercises were developed by Minerva Ends, DC with a strong emphasis on core neuromuscular reducation and postural realignment through body-weight exercises. . If any lack of improvement consider further diagnostic evaluation with MRI of the cervical spine. Neck pain Previously followed by chiropractic responded well to the past.  She is markedly tight in her right periscapular and right pec area consistent with likely some thoracic outlet syndrome.  Low-dose of Valium for muscle relaxant properties discussed in detail.  She has had issues with anticholinergic meds in the past and she should tolerate this well.  Follow-up: Return in about 2 weeks (around 02/03/2018).      Please see additional documentation for Objective, Assessment and Plan sections. Pertinent additional documentation may be included in corresponding procedure notes, imaging studies, problem based documentation and patient instructions. Please see these sections of the encounter for additional information regarding this visit.  CMA/ATC served as Education administrator during this visit. History, Physical, and Plan performed by medical provider. Documentation and orders reviewed and attested to.      Gerda Diss, [facility] Physician

## 2018-01-20 NOTE — Assessment & Plan Note (Signed)
Previously followed by chiropractic responded well to the past.  She is markedly tight in her right periscapular and right pec area consistent with likely some thoracic outlet syndrome.  Low-dose of Valium for muscle relaxant properties discussed in detail.  She has had issues with anticholinergic meds in the past and she should tolerate this well.

## 2018-02-03 ENCOUNTER — Ambulatory Visit: Payer: BLUE CROSS/BLUE SHIELD | Admitting: Sports Medicine

## 2018-04-11 ENCOUNTER — Ambulatory Visit: Payer: BLUE CROSS/BLUE SHIELD | Admitting: Allergy

## 2018-04-11 ENCOUNTER — Encounter: Payer: Self-pay | Admitting: Allergy

## 2018-04-11 VITALS — BP 118/70 | HR 75 | Temp 97.6°F | Resp 18 | Ht 65.0 in | Wt 181.4 lb

## 2018-04-11 DIAGNOSIS — J4540 Moderate persistent asthma, uncomplicated: Secondary | ICD-10-CM | POA: Diagnosis not present

## 2018-04-11 DIAGNOSIS — L508 Other urticaria: Secondary | ICD-10-CM | POA: Diagnosis not present

## 2018-04-11 DIAGNOSIS — J3089 Other allergic rhinitis: Secondary | ICD-10-CM | POA: Diagnosis not present

## 2018-04-11 MED ORDER — ALBUTEROL SULFATE 108 (90 BASE) MCG/ACT IN AEPB: 2.0000 | INHALATION_SPRAY | RESPIRATORY_TRACT | 1 refills | Status: DC | PRN

## 2018-04-11 MED ORDER — MONTELUKAST SODIUM 10 MG PO TABS: 10.0000 mg | ORAL_TABLET | Freq: Every day | ORAL | 5 refills | Status: DC

## 2018-04-11 NOTE — Progress Notes (Signed)
Follow Up Note  RE: [name] MRN: [id number] DOB: 1970-10-19 Date of Office Visit: 04/11/2018  Referring provider: Briscoe Deutscher, DO Primary care provider: Briscoe Deutscher, DO  Chief Complaint: Shortness of Breath  History of Present Illness: I had the pleasure of seeing [name] for a follow up visit at [facility] on 04/11/2018. She is a 47 y.o. female, who is being followed for asthma and hives. Today she is here for new complaint of sob. Her previous allergy office visit was on 02/18/2017 with Dr. Nelva Bush.   Moderate Persistent Asthma/Allergic rhinitis: Patient does have dust mite covers in place and washing sheets weekly. She noticed that her dust mite allergy has been worsening the last 3 months with increased nasal congestion. Last night she had coughing and trouble breathing. She used her albuterol 1 puff with some benefit but it was an expired medication.   Denies ER/urgent care visits or prednisone use since the last visit. She does not like to take daily medications.  Using Flonase sensamist as needed with some benefit.   Hives: Not on daily antihistamines. Usually triggered by stress and tomatoes.   Assessment and Plan: [name] is a 47 y.o. female with: Moderate persistent asthma without complication Increased coughing and trouble breathing x few days. Used albuterol with good benefit.   Today's spirometry was normal.  Discussed with patient doing a trial of ICS versus Singulair and she opted to try Singulair first.  May use albuterol rescue inhaler 2 puffs or nebulizer every 4 to 6 hours as needed for shortness of breath, chest tightness, coughing, and wheezing. May use albuterol rescue inhaler 2 puffs 5 to 15 minutes prior to strenuous physical activities.  Start singulair 10mg  daily at night.   Other allergic rhinitis Worsening nasal congestion the last 3 months.  Continue environmental control measures.   May use  Flonase 1-2 sprays 1times daily as needed.   Start Singulair 10mg  daily.   May use over the counter antihistamines such as Zyrtec (cetirizine), Claritin (loratadine), Allegra (fexofenadine), or Xyzal (levocetirizine) daily as needed.  Consider repeat testing at next visit as last skin testing was done in 2012 which was positive to dust mites only.  Urticaria, chronic Usually triggered by stress and tomatoes.  Monitor symptoms.  May use over the counter antihistamines such as Zyrtec (cetirizine), Claritin (loratadine), Allegra (fexofenadine), or Xyzal (levocetirizine) daily as needed.  Return in about 2 months (around 06/11/2018) for Skin testing.  Meds ordered this encounter  Medications  . montelukast (SINGULAIR) 10 MG tablet    Sig: Take 1 tablet (10 mg total) by mouth at bedtime.    Dispense:  30 tablet    Refill:  5  . Albuterol Sulfate 108 (90 Base) MCG/ACT AEPB    Sig: Inhale 2 puffs into the lungs every 4 (four) hours as needed (wheezing, coughing, shortness of breath, chest tightness).    Dispense:  1 each    Refill:  1   Diagnostics: Spirometry:  Tracings reviewed. Her effort: Good reproducible efforts. FVC: 3.88L FEV1: 3.22L, 108% predicted FEV1/FVC ratio: 83% Interpretation: Spirometry consistent with normal pattern.  Please see scanned spirometry results for details.  Medication List:  Current Outpatient Medications  Medication Sig Dispense Refill  . calcitRIOL (ROCALTROL) 0.25 MCG capsule Take 0.25 mcg by mouth 2 (two) times daily. Alternating days of 2 capsules and 1 capsule.    . Calcium Citrate-Vitamin D (CALCIUM CITRATE + D) 315-250 MG-UNIT TABS Take by mouth  2 (two) times daily.    . Cholecalciferol (VITAMIN D3) 1000 units CAPS Take by mouth daily.     . hydrochlorothiazide (MICROZIDE) 12.5 MG capsule Take 12.5 mg by mouth daily.    . Magnesium 200 MG TABS Take 2 tablets by mouth daily.     Marland Kitchen thyroid (ARMOUR) 15 MG tablet Take 15 mg by mouth every other  day.    . thyroid (ARMOUR) 90 MG tablet Take 90 mg by mouth daily.    . Albuterol Sulfate 108 (90 Base) MCG/ACT AEPB Inhale 2 puffs into the lungs every 4 (four) hours as needed (wheezing, coughing, shortness of breath, chest tightness). 1 each 1  . montelukast (SINGULAIR) 10 MG tablet Take 1 tablet (10 mg total) by mouth at bedtime. 30 tablet 5   No current facility-administered medications for this visit.    Allergies: Allergies  Allergen Reactions  . Compazine [Prochlorperazine Edisylate] Other (See Comments)    Heart rate   . Ciprofloxacin   . Codeine Itching  . Latex   . Metoclopramide Other (See Comments)    akathisia  . Sertraline Other (See Comments)    akathisia  . Benadryl [Diphenhydramine Hcl (Sleep)] Anxiety   I reviewed her past medical history, social history, family history, and environmental history and no significant changes have been reported from previous visit on 02/18/2017.  Review of Systems  Constitutional: Negative for appetite change, chills, fever and unexpected weight change.  HENT: Positive for congestion. Negative for rhinorrhea.   Eyes: Negative for itching.  Respiratory: Positive for shortness of breath. Negative for cough, chest tightness and wheezing.   Gastrointestinal: Negative for abdominal pain.  Skin: Negative for rash.  Neurological: Negative for headaches.   Objective: BP 118/70 (BP Location: Left Arm, Patient Position: Sitting, Cuff Size: Normal)   Pulse 75   Temp 97.6 F (36.4 C) (Oral)   Resp 18   Ht 5\' 5"  (1.651 m)   Wt 181 lb 6.4 oz (82.3 kg)   SpO2 97%   BMI 30.19 kg/m  Body mass index is 30.19 kg/m. Physical Exam  Constitutional: She is oriented to person, place, and time. She appears well-developed and well-nourished.  HENT:  Head: Normocephalic and atraumatic.  Right Ear: External ear normal.  Left Ear: External ear normal.  Nose: Mucosal edema present.  Mouth/Throat: Oropharynx is clear and moist.  Eyes:  Conjunctivae and EOM are normal.  Neck: Neck supple.  Cardiovascular: Normal rate, regular rhythm and normal heart sounds. Exam reveals no gallop and no friction rub.  No murmur heard. Pulmonary/Chest: Effort normal and breath sounds normal. She has no wheezes. She has no rales.  Neurological: She is alert and oriented to person, place, and time.  Skin: Skin is warm. No rash noted.  Psychiatric: She has a normal mood and affect. Her behavior is normal.  Nursing note and vitals reviewed.  Previous notes and tests were reviewed. The plan was reviewed with the patient/family, and all questions/concerned were addressed.  It was my pleasure to see [name] today and participate in her care. Please feel free to contact me with any questions or concerns.  Sincerely,  Rexene Alberts, DO Allergy & Immunology  [facility] office: [phone] [place]

## 2018-04-11 NOTE — Assessment & Plan Note (Signed)
Worsening nasal congestion the last 3 months.  Continue environmental control measures.   May use Flonase 1-2 sprays 1times daily as needed.   Start Singulair 10mg  daily.   May use over the counter antihistamines such as Zyrtec (cetirizine), Claritin (loratadine), Allegra (fexofenadine), or Xyzal (levocetirizine) daily as needed.  Consider repeat testing at next visit as last skin testing was done in 2012 which was positive to dust mites only.

## 2018-04-11 NOTE — Assessment & Plan Note (Signed)
Usually triggered by stress and tomatoes.  Monitor symptoms.  May use over the counter antihistamines such as Zyrtec (cetirizine), Claritin (loratadine), Allegra (fexofenadine), or Xyzal (levocetirizine) daily as needed.

## 2018-04-11 NOTE — Assessment & Plan Note (Signed)
Increased coughing and trouble breathing x few days. Used albuterol with good benefit.   Today's spirometry was normal.  Discussed with patient doing a trial of ICS versus Singulair and she opted to try Singulair first.  May use albuterol rescue inhaler 2 puffs or nebulizer every 4 to 6 hours as needed for shortness of breath, chest tightness, coughing, and wheezing. May use albuterol rescue inhaler 2 puffs 5 to 15 minutes prior to strenuous physical activities.  Start singulair 10mg  daily at night.

## 2018-04-11 NOTE — Patient Instructions (Addendum)
   May use albuterol rescue inhaler 2 puffs or nebulizer every 4 to 6 hours as needed for shortness of breath, chest tightness, coughing, and wheezing. May use albuterol rescue inhaler 2 puffs 5 to 15 minutes prior to strenuous physical activities.  May use over the counter antihistamines such as Zyrtec (cetirizine), Claritin (loratadine), Allegra (fexofenadine), or Xyzal (levocetirizine) daily as needed.  Start singulair 10mg  daily at night.   May use Flonase 1-2 sprays 1times daily as needed.   Follow up in 2 months.   Control of House Dust Mite Allergen . Dust mite allergens are a common trigger of allergy and asthma symptoms. While they can be found throughout the house, these microscopic creatures thrive in warm, humid environments such as bedding, upholstered furniture and carpeting. . Because so much time is spent in the bedroom, it is essential to reduce mite levels there.  . Encase pillows, mattresses, and box springs in special allergen-proof fabric covers or airtight, zippered plastic covers.  . Bedding should be washed weekly in hot water (130 F) and dried in a hot dryer. Allergen-proof covers are available for comforters and pillows that can't be regularly washed.  Wendee Copp the allergy-proof covers every few months. Minimize clutter in the bedroom. Keep pets out of the bedroom.  Marland Kitchen Keep humidity less than 50% by using a dehumidifier or air conditioning. You can buy a humidity measuring device called a hygrometer to monitor this.  . If possible, replace carpets with hardwood, linoleum, or washable area rugs. If that's not possible, vacuum frequently with a vacuum that has a HEPA filter. . Remove all upholstered furniture and non-washable window drapes from the bedroom. . Remove all non-washable stuffed toys from the bedroom.  Wash stuffed toys weekly.

## 2018-04-12 ENCOUNTER — Ambulatory Visit: Payer: BLUE CROSS/BLUE SHIELD | Admitting: Family Medicine

## 2018-04-17 NOTE — Progress Notes (Signed)
[name] is a 47 y.o. female here for an acute visit.  History of Present Illness:   [name] acting as scribe for [name].   Sinusitis  This is a new problem. The current episode started 1 to 4 weeks ago. The problem is unchanged. There has been no fever. Associated symptoms include congestion, coughing, shortness of breath and sinus pressure. Pertinent negatives include no chills, ear pain, sneezing or sore throat. (Cough productive at night only ) The treatment provided no relief.   PMHx, SurgHx, SocialHx, Medications, and Allergies were reviewed in the Visit Navigator and updated as appropriate.  Current Medications:   .  Albuterol Sulfate 108 (90 Base) MCG/ACT AEPB, Inhale 2 puffs into the lungs every 4 (four) hours as needed (wheezing, coughing, shortness of breath, chest tightness)., Disp: 1 each, Rfl: 1 .  calcitRIOL (ROCALTROL) 0.25 MCG capsule, Take 0.25 mcg by mouth 2 (two) times daily. Alternating days of 2 capsules and 1 capsule., Disp: , Rfl:  .  Calcium Citrate-Vitamin D (CALCIUM CITRATE + D) 315-250 MG-UNIT TABS, Take by mouth 2 (two) times daily., Disp: , Rfl:  .  Cholecalciferol (VITAMIN D3) 1000 units CAPS, Take by mouth daily. , Disp: , Rfl:  .  hydrochlorothiazide (MICROZIDE) 12.5 MG capsule, Take 12.5 mg by mouth daily., Disp: , Rfl:  .  Magnesium 200 MG TABS, Take 2 tablets by mouth daily. , Disp: , Rfl:  .  montelukast (SINGULAIR) 10 MG tablet, Take 1 tablet (10 mg total) by mouth at bedtime., Disp: 30 tablet, Rfl: 5 .  thyroid (ARMOUR) 15 MG tablet, Take 15 mg by mouth every other day., Disp: , Rfl:  .  thyroid (ARMOUR) 90 MG tablet, Take 90 mg by mouth daily., Disp: , Rfl:    Allergies  Allergen Reactions  . Compazine [Prochlorperazine Edisylate] Other (See Comments)    Heart rate   . Ciprofloxacin   . Codeine Itching  . Latex   . Metoclopramide Other (See Comments)    akathisia  . Sertraline Other (See Comments)   akathisia  . Benadryl [Diphenhydramine Hcl (Sleep)] Anxiety   Review of Systems:   Pertinent items are noted in the HPI. Otherwise, ROS is negative.  Vitals:   Vitals:   04/18/18 0742  BP: 118/70  Pulse: 84  Temp: 97.6 F (36.4 C)  TempSrc: Oral  SpO2: 97%  Weight: 180 lb 9.6 oz (81.9 kg)  Height: 5\' 5"  (1.651 m)     Body mass index is 30.05 kg/m.  Physical Exam:   Physical Exam  Constitutional: She appears well-nourished.  HENT:  Head: Normocephalic and atraumatic.  Nose: Mucosal edema and rhinorrhea present. Right sinus exhibits maxillary sinus tenderness. Left sinus exhibits maxillary sinus tenderness.  Eyes: Pupils are equal, round, and reactive to light. EOM are normal.  Neck: Normal range of motion. Neck supple.  Cardiovascular: Normal rate, regular rhythm, normal heart sounds and intact distal pulses.  Pulmonary/Chest: Effort normal.  Abdominal: Soft.  Skin: Skin is warm.  Psychiatric: She has a normal mood and affect. Her behavior is normal.  Nursing note and vitals reviewed.  Assessment and Plan:   Other allergic rhinitis History: ENT visit reviewed. She was Rx Singulair, but too scared to take due to possibility of psychiatric issues. Note: patient has Hx of severe mood changes, with impulsive thoughts (wanting to jump out of a window) with antihistamine use.  Assessment/Plan: Encouraged use of Flonase. She will read about Astelin. To ENT.  Moderate persistent asthma without complication History: Doing well. No concerns. Tolerating medications.  Assessment/Plan:       Continue current treatment. See orders as documented in the EMR.   . Reviewed expectations re: course of current medical issues. . Discussed self-management of symptoms. . Outlined signs and symptoms indicating need for more acute intervention. . Patient verbalized understanding and all questions were answered. Marland Kitchen Health Maintenance issues including appropriate healthy diet, exercise,  and smoking avoidance were discussed with patient. . See orders for this visit as documented in the electronic medical record. . Patient received an After Visit Summary.  Hunt served as Education administrator during this visit. History, Physical, and Plan performed by medical provider. The above documentation has been reviewed and is accurate and complete. [name], D.O.  Shannon Deutscher, DO [place], [facility] 04/18/2018

## 2018-04-18 ENCOUNTER — Encounter: Payer: Self-pay | Admitting: Family Medicine

## 2018-04-18 ENCOUNTER — Ambulatory Visit: Payer: BLUE CROSS/BLUE SHIELD | Admitting: Family Medicine

## 2018-04-18 VITALS — BP 118/70 | HR 84 | Temp 97.6°F | Ht 65.0 in | Wt 180.6 lb

## 2018-04-18 DIAGNOSIS — J4540 Moderate persistent asthma, uncomplicated: Secondary | ICD-10-CM

## 2018-04-18 DIAGNOSIS — J324 Chronic pansinusitis: Secondary | ICD-10-CM

## 2018-04-18 DIAGNOSIS — J3089 Other allergic rhinitis: Secondary | ICD-10-CM

## 2018-04-18 NOTE — Patient Instructions (Signed)
I am sending you to an ENT physician. Take Flonase as we discussed. Call if you feel an overt sinus infection. Read about ASTELIN.

## 2018-04-18 NOTE — Assessment & Plan Note (Signed)
History: ENT visit reviewed. She was Rx Singulair, but too scared to take due to possibility of psychiatric issues. Note: patient has Hx of severe mood changes, with impulsive thoughts (wanting to jump out of a window) with antihistamine use.  Assessment/Plan: Encouraged use of Flonase. She will read about Astelin. To ENT.

## 2018-04-18 NOTE — Assessment & Plan Note (Signed)
History: Doing well. No concerns. Tolerating medications.  Assessment/Plan:       Continue current treatment. See orders as documented in the EMR.  

## 2018-05-02 ENCOUNTER — Ambulatory Visit (INDEPENDENT_AMBULATORY_CARE_PROVIDER_SITE_OTHER): Payer: BLUE CROSS/BLUE SHIELD

## 2018-05-02 ENCOUNTER — Encounter: Payer: Self-pay | Admitting: Family Medicine

## 2018-05-02 ENCOUNTER — Ambulatory Visit: Payer: BLUE CROSS/BLUE SHIELD | Admitting: Family Medicine

## 2018-05-02 VITALS — BP 128/88 | HR 73 | Temp 97.8°F | Ht 65.0 in | Wt 179.4 lb

## 2018-05-02 DIAGNOSIS — R059 Cough, unspecified: Secondary | ICD-10-CM

## 2018-05-02 DIAGNOSIS — R05 Cough: Secondary | ICD-10-CM

## 2018-05-02 MED ORDER — BENZONATATE 100 MG PO CAPS: 100.0000 mg | ORAL_CAPSULE | Freq: Three times a day (TID) | ORAL | 0 refills | Status: DC | PRN

## 2018-05-02 NOTE — Progress Notes (Signed)
Patient: [name] MRN: [id number] DOB: [birth year] PCP: Briscoe Deutscher, DO     Subjective:  Chief Complaint  Patient presents with  . Cough    HPI: The patient is a 47 y.o. female who presents today for cough, chest congestion on and off x 3 wks. She states she feels like she gets something caught and then will have a coughing fit x 1 hour and has chest heaviness and hurts. She feels like her chest is tight and doesn't feel like she is getting enough air. She is tired and run down. She has no leg swelling and no history of heart disease. She has not tired anything over the counter. NO fever/chills. She does have shortness of breath and wheezing when it happens (but appears supra glottic). She does not smoke and has no known asthma. She has flonase, but does not use nightly. Discussed dymista with pcp but she has extreme anxiety with using allergy medication due to past side effect with one. She has not used her inhaler.    Sees ENT in a few weeks for chronic sinusitis.   Review of Systems  Constitutional: Positive for fatigue. Negative for chills and fever.  HENT: Positive for congestion, postnasal drip and rhinorrhea. Negative for ear pain, sinus pressure, sinus pain and sore throat.   Respiratory: Positive for cough and shortness of breath.   Cardiovascular: Positive for chest pain.       Chest hurts when coughing  Gastrointestinal: Positive for nausea. Negative for abdominal pain and vomiting.  Musculoskeletal: Negative for back pain and neck pain.  Neurological: Positive for dizziness and headaches.  Psychiatric/Behavioral: The patient is nervous/anxious.     Allergies Patient is allergic to compazine [prochlorperazine edisylate]; ciprofloxacin; codeine; latex; metoclopramide; sertraline; and benadryl [diphenhydramine hcl (sleep)].  Past Medical History Patient  has a past medical history of Akathisia, Cancer ([place]), Endometriosis, Hypocalcemia, Hypoparathyroidism  ([place]), Hypothyroidism, Kidney stones, Latex allergy, and Thyroid disease.  Surgical History Patient  has a past surgical history that includes Cesarean section (2001); Abdominal surgery; Thyroidectomy; NM MYOCAR PERF WALL MOTION (07/09/2010); and OTHER SURGICAL HISTORY.  Family History Pateint's family history includes Food Allergy in her daughter. She was adopted.  Social History Patient  reports that she has never smoked. She has never used smokeless tobacco. She reports that she does not drink alcohol or use drugs.    Objective: Vitals:   05/02/18 1349  BP: 128/88  Pulse: 73  Temp: 97.8 F (36.6 C)  TempSrc: Oral  SpO2: 98%  Weight: 179 lb 6.4 oz (81.4 kg)  Height: 5\' 5"  (1.651 m)    Body mass index is 29.85 kg/m.  Physical Exam  Constitutional: She appears well-developed and well-nourished.  HENT:  Right Ear: External ear normal.  Left Ear: External ear normal.  Cobblestoning on posterior pharynx with mild erythema   Neck: Normal range of motion. Neck supple.  Cardiovascular: Normal rate, regular rhythm and normal heart sounds.  Pulmonary/Chest: Effort normal and breath sounds normal. She has no wheezes. She has no rales.  Abdominal: Soft. Bowel sounds are normal.  Lymphadenopathy:    She has no cervical adenopathy.  Vitals reviewed.   CXR: no acute consolidation.     Assessment/plan: 1. Cough She def. Has some post nasal drip and want her to use flonase nightly x 2+ weeks. Xray okay. No antibiotics indicated at this time. Conservative therapy with cool mist humidifier at night, rest/fluids, and honey daily. Recommend 1 tablespoon/day. Also recommended over the counter  anti tussive medication: robitussin DM during the day and could do a nyquil at night. Sending in tessalon pearls prn for cough. Precautions given for worsening symptoms, fever, shortness of breath to let us know immediatly. Also want her to try her inhaler prn to see if this helps her as well.  Discussed reflux, but she doesn't want to take medication for this due to some drug/drug interaction and her calcium absorption. Do not understand this fully and recommended she try some antireflux medication for a few days (tums) Sees ENT in a few weeks.   - DG Chest 2 View; Future    Return if symptoms worsen or fail to improve.   Orma Flaming, MD [place]   05/02/2018

## 2018-05-10 DIAGNOSIS — Z3200 Encounter for pregnancy test, result unknown: Secondary | ICD-10-CM | POA: Diagnosis not present

## 2018-05-10 DIAGNOSIS — R1031 Right lower quadrant pain: Secondary | ICD-10-CM | POA: Diagnosis not present

## 2018-05-10 DIAGNOSIS — R82998 Other abnormal findings in urine: Secondary | ICD-10-CM | POA: Diagnosis not present

## 2018-05-17 DIAGNOSIS — R0982 Postnasal drip: Secondary | ICD-10-CM | POA: Diagnosis not present

## 2018-05-17 DIAGNOSIS — J320 Chronic maxillary sinusitis: Secondary | ICD-10-CM | POA: Diagnosis not present

## 2018-05-17 DIAGNOSIS — J343 Hypertrophy of nasal turbinates: Secondary | ICD-10-CM | POA: Diagnosis not present

## 2018-05-17 DIAGNOSIS — J310 Chronic rhinitis: Secondary | ICD-10-CM | POA: Diagnosis not present

## 2018-06-07 DIAGNOSIS — J3081 Allergic rhinitis due to animal (cat) (dog) hair and dander: Secondary | ICD-10-CM | POA: Diagnosis not present

## 2018-06-07 DIAGNOSIS — J309 Allergic rhinitis, unspecified: Secondary | ICD-10-CM | POA: Diagnosis not present

## 2018-06-07 DIAGNOSIS — R05 Cough: Secondary | ICD-10-CM | POA: Diagnosis not present

## 2018-06-10 DIAGNOSIS — J3081 Allergic rhinitis due to animal (cat) (dog) hair and dander: Secondary | ICD-10-CM | POA: Diagnosis not present

## 2018-06-15 ENCOUNTER — Ambulatory Visit: Payer: Self-pay | Admitting: Allergy

## 2018-07-11 ENCOUNTER — Other Ambulatory Visit: Payer: Self-pay | Admitting: Family Medicine

## 2018-07-11 DIAGNOSIS — Z87442 Personal history of urinary calculi: Secondary | ICD-10-CM | POA: Insufficient documentation

## 2018-07-11 DIAGNOSIS — N6320 Unspecified lump in the left breast, unspecified quadrant: Secondary | ICD-10-CM

## 2018-07-11 DIAGNOSIS — E890 Postprocedural hypothyroidism: Secondary | ICD-10-CM | POA: Insufficient documentation

## 2018-07-11 DIAGNOSIS — E892 Postprocedural hypoparathyroidism: Secondary | ICD-10-CM | POA: Insufficient documentation

## 2018-07-11 DIAGNOSIS — Z9009 Acquired absence of other part of head and neck: Secondary | ICD-10-CM | POA: Insufficient documentation

## 2018-07-11 DIAGNOSIS — Z9889 Other specified postprocedural states: Secondary | ICD-10-CM | POA: Insufficient documentation

## 2018-07-12 ENCOUNTER — Other Ambulatory Visit: Payer: Self-pay | Admitting: Obstetrics & Gynecology

## 2018-07-12 DIAGNOSIS — N6320 Unspecified lump in the left breast, unspecified quadrant: Secondary | ICD-10-CM

## 2018-07-15 ENCOUNTER — Ambulatory Visit
Admission: RE | Admit: 2018-07-15 | Discharge: 2018-07-15 | Disposition: A | Discharge status: Home / Self Care | Payer: BLUE CROSS/BLUE SHIELD | Source: Ambulatory Visit | Attending: Obstetrics & Gynecology | Admitting: Obstetrics & Gynecology

## 2018-07-15 DIAGNOSIS — N6001 Solitary cyst of right breast: Secondary | ICD-10-CM | POA: Diagnosis not present

## 2018-07-15 DIAGNOSIS — N6320 Unspecified lump in the left breast, unspecified quadrant: Secondary | ICD-10-CM

## 2018-07-15 DIAGNOSIS — R922 Inconclusive mammogram: Secondary | ICD-10-CM | POA: Diagnosis not present

## 2018-07-29 ENCOUNTER — Ambulatory Visit: Payer: BLUE CROSS/BLUE SHIELD | Admitting: Family Medicine

## 2018-07-29 ENCOUNTER — Encounter

## 2018-08-04 DIAGNOSIS — Z818 Family history of other mental and behavioral disorders: Secondary | ICD-10-CM | POA: Diagnosis not present

## 2018-08-04 DIAGNOSIS — E892 Postprocedural hypoparathyroidism: Secondary | ICD-10-CM | POA: Diagnosis not present

## 2018-08-04 DIAGNOSIS — R7879 Finding of abnormal level of heavy metals in blood: Secondary | ICD-10-CM | POA: Diagnosis not present

## 2018-08-04 DIAGNOSIS — E559 Vitamin D deficiency, unspecified: Secondary | ICD-10-CM | POA: Diagnosis not present

## 2018-08-04 DIAGNOSIS — E7212 Methylenetetrahydrofolate reductase deficiency: Secondary | ICD-10-CM | POA: Diagnosis not present

## 2018-08-09 DIAGNOSIS — Z6829 Body mass index (BMI) 29.0-29.9, adult: Secondary | ICD-10-CM | POA: Diagnosis not present

## 2018-08-09 DIAGNOSIS — Z01419 Encounter for gynecological examination (general) (routine) without abnormal findings: Secondary | ICD-10-CM | POA: Diagnosis not present

## 2018-09-21 DIAGNOSIS — R05 Cough: Secondary | ICD-10-CM | POA: Diagnosis not present

## 2018-09-21 DIAGNOSIS — J3081 Allergic rhinitis due to animal (cat) (dog) hair and dander: Secondary | ICD-10-CM | POA: Diagnosis not present

## 2018-09-21 DIAGNOSIS — J309 Allergic rhinitis, unspecified: Secondary | ICD-10-CM | POA: Diagnosis not present

## 2018-09-23 ENCOUNTER — Encounter: Payer: BLUE CROSS/BLUE SHIELD | Admitting: Family Medicine

## 2018-09-28 ENCOUNTER — Encounter: Payer: BLUE CROSS/BLUE SHIELD | Admitting: Family Medicine

## 2018-10-07 DIAGNOSIS — J302 Other seasonal allergic rhinitis: Secondary | ICD-10-CM | POA: Diagnosis not present

## 2018-10-07 DIAGNOSIS — J00 Acute nasopharyngitis [common cold]: Secondary | ICD-10-CM | POA: Diagnosis not present

## 2018-10-07 DIAGNOSIS — D8940 Mast cell activation, unspecified: Secondary | ICD-10-CM | POA: Diagnosis not present

## 2018-10-07 DIAGNOSIS — C73 Malignant neoplasm of thyroid gland: Secondary | ICD-10-CM | POA: Diagnosis not present

## 2018-10-07 LAB — BASIC METABOLIC PANEL
BUN: 11 (ref 4–21)
Creatinine: 0.8 (ref 0.5–1.1)
Glucose: 102
Potassium: 4.1 (ref 3.4–5.3)
Sodium: 142 (ref 137–147)

## 2018-10-07 LAB — TSH: TSH: 0.03 — AB (ref 0.41–5.90)

## 2018-10-07 LAB — HEPATIC FUNCTION PANEL
ALT: 15 (ref 7–35)
AST: 11 — AB (ref 13–35)
Alkaline Phosphatase: 82 (ref 25–125)
Bilirubin, Total: 0.5

## 2018-10-07 IMAGING — MG 2D DIGITAL SCREENING BILATERAL MAMMOGRAM WITH CAD AND ADJUNCT TO
8 of 12 series · 8 of 28 positions shown · non-contrast
Comparison: Previous exam(s).

CLINICAL DATA: Screening.

EXAM:
2D DIGITAL SCREENING BILATERAL MAMMOGRAM WITH CAD AND ADJUNCT TOMO

[R MLO synth-2D]
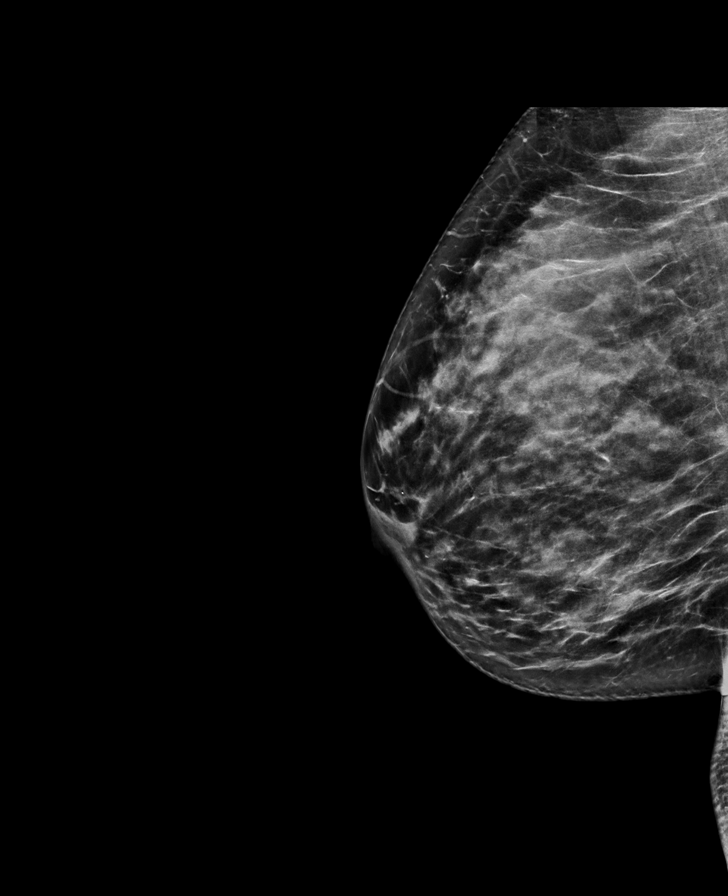

[R MLO]
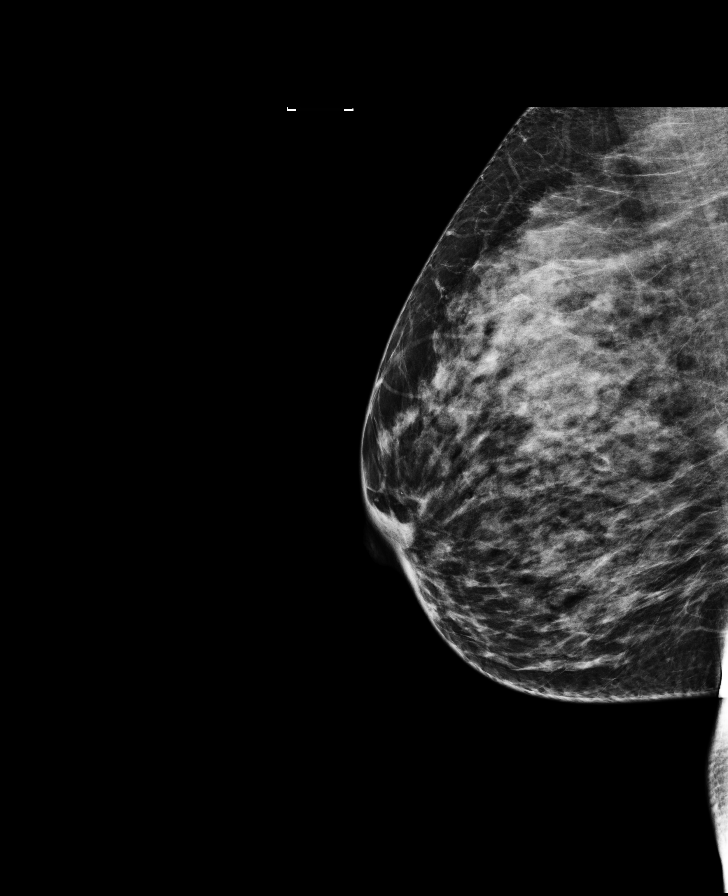

[L MLO synth-2D]
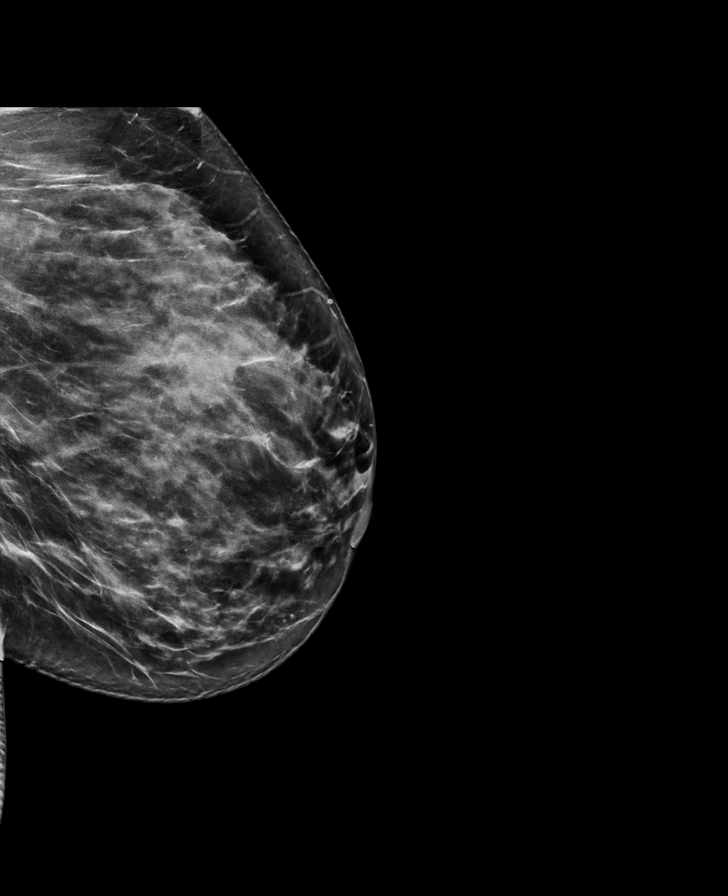

[R CC synth-2D]
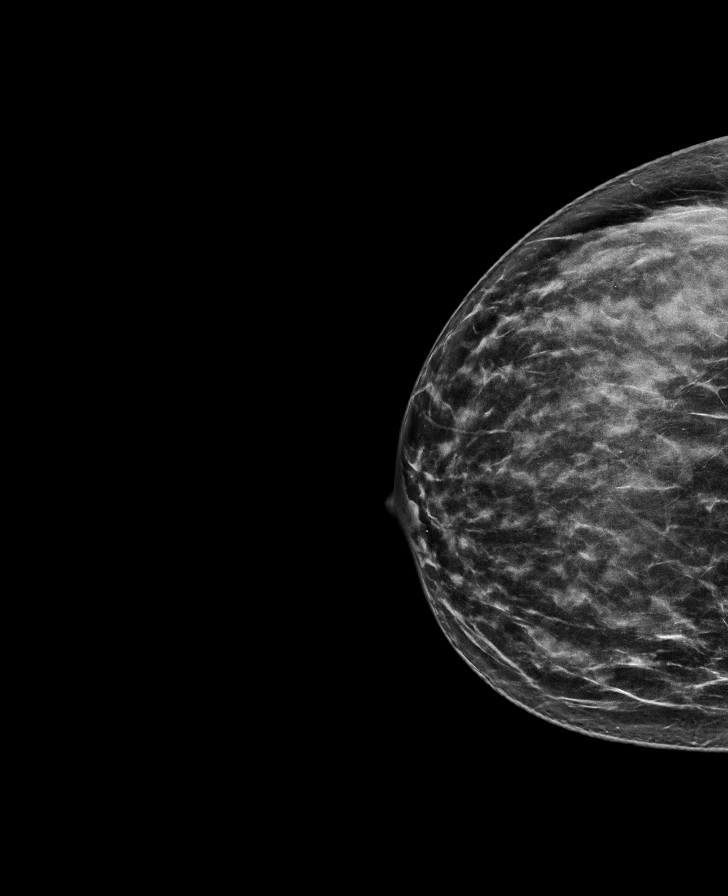

[L CC synth-2D]
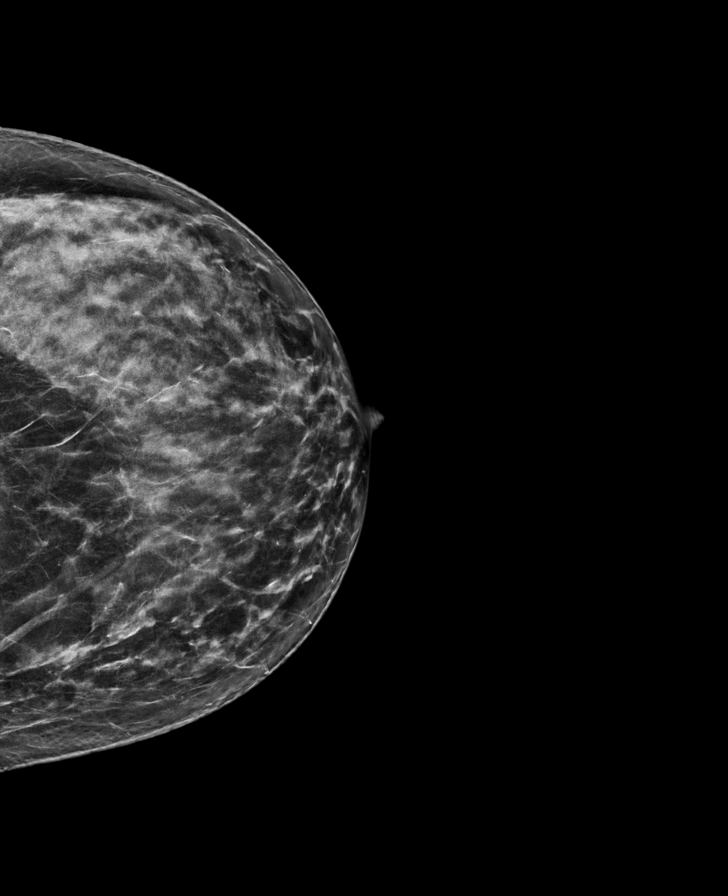

[L CC]
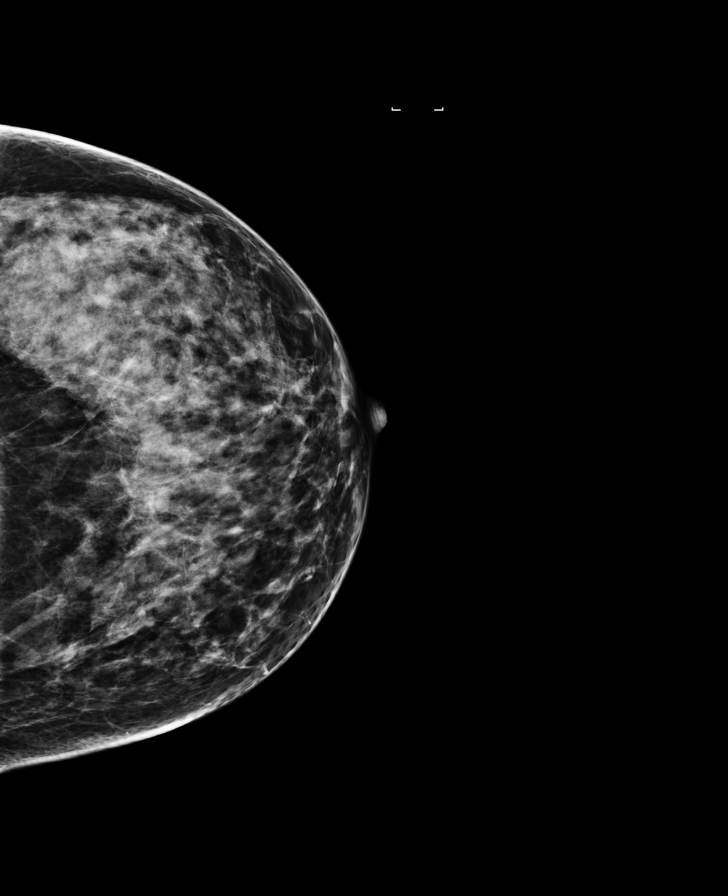

[L MLO]
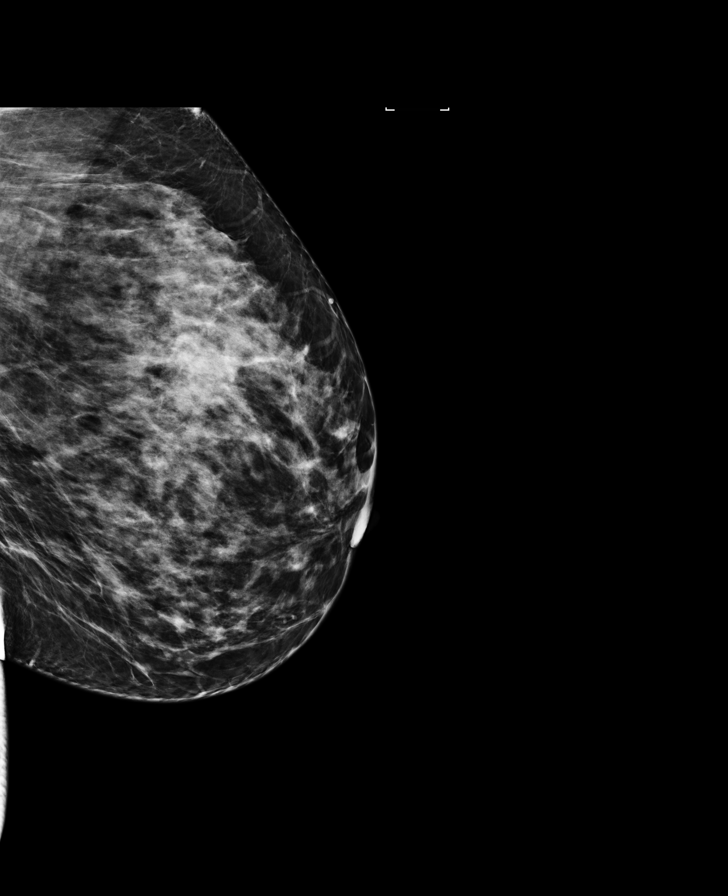

[R CC]
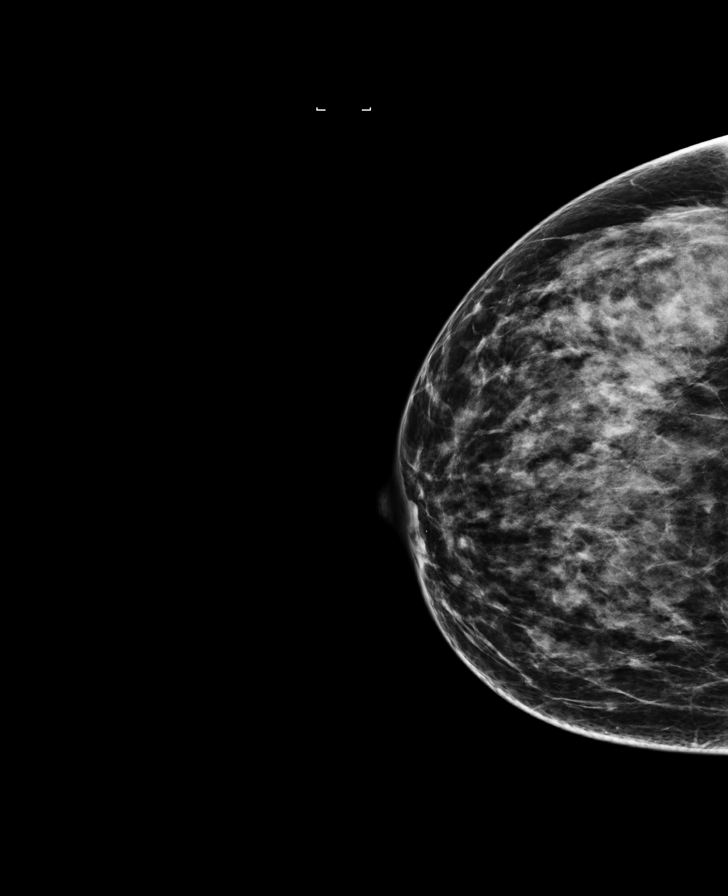

[8 of 28 positions shown; findings below may reference images not displayed]

ACR Breast Density Category c: The breast tissue is heterogeneously
dense, which may obscure small masses.
FINDINGS: In the left breast, a possible mass warrants further evaluation. In
the right breast, no findings suspicious for malignancy.

Images were processed with CAD.
IMPRESSION: Further evaluation is suggested for possible mass in the left
breast.

RECOMMENDATION:
Diagnostic mammogram and possibly ultrasound of the left breast.
(Code:[id number])

The patient will be contacted regarding the findings, and additional
imaging will be scheduled.

BI-RADS CATEGORY  0: Incomplete. Need additional imaging evaluation
and/or prior mammograms for comparison.

## 2018-10-11 LAB — CBC AND DIFFERENTIAL
Neutrophils Absolute: 4
Platelets: 284 (ref 150–399)
WBC: 6

## 2018-10-19 DIAGNOSIS — N200 Calculus of kidney: Secondary | ICD-10-CM | POA: Diagnosis not present

## 2018-10-19 DIAGNOSIS — C73 Malignant neoplasm of thyroid gland: Secondary | ICD-10-CM | POA: Diagnosis not present

## 2018-10-27 DIAGNOSIS — J309 Allergic rhinitis, unspecified: Secondary | ICD-10-CM | POA: Diagnosis not present

## 2018-10-27 DIAGNOSIS — R05 Cough: Secondary | ICD-10-CM | POA: Diagnosis not present

## 2018-10-27 DIAGNOSIS — J3081 Allergic rhinitis due to animal (cat) (dog) hair and dander: Secondary | ICD-10-CM | POA: Diagnosis not present

## 2018-11-18 ENCOUNTER — Telehealth: Payer: Self-pay | Admitting: Physical Therapy

## 2018-11-18 NOTE — Telephone Encounter (Signed)
Copied from [place] [id number]. Topic: General - Inquiry >> Nov 18, 2018 11:30 AM Mathis Bud wrote: Reason for CRM: Patient is returning [facility] prescreening phone call for appt 6/22.   Patient call back # [phone]

## 2018-11-21 ENCOUNTER — Encounter: Payer: Self-pay | Admitting: Family Medicine

## 2018-11-21 ENCOUNTER — Other Ambulatory Visit: Payer: Self-pay

## 2018-11-21 ENCOUNTER — Ambulatory Visit (INDEPENDENT_AMBULATORY_CARE_PROVIDER_SITE_OTHER): Payer: BC Managed Care – PPO | Admitting: Family Medicine

## 2018-11-21 VITALS — BP 132/80 | HR 88 | Ht 65.0 in | Wt 170.0 lb

## 2018-11-21 DIAGNOSIS — Z13220 Encounter for screening for lipoid disorders: Secondary | ICD-10-CM | POA: Diagnosis not present

## 2018-11-21 DIAGNOSIS — R5383 Other fatigue: Secondary | ICD-10-CM

## 2018-11-21 DIAGNOSIS — Z0000 Encounter for general adult medical examination without abnormal findings: Secondary | ICD-10-CM

## 2018-11-21 DIAGNOSIS — R5381 Other malaise: Secondary | ICD-10-CM

## 2018-11-21 DIAGNOSIS — E890 Postprocedural hypothyroidism: Secondary | ICD-10-CM

## 2018-11-21 NOTE — Patient Instructions (Addendum)
Astelin nasal spray, chlorpheniramine

## 2018-11-21 NOTE — Progress Notes (Signed)
Subjective:    [name] is a 48 y.o. female and is here for a comprehensive physical exam.  Also, she has additional complaints of chronic rhinitis, PND, congestion. Allergy to cats. Own two of them. Emotionally difficult to re-home them due to "mommy guilt." Has been offered allergy shots, but feels that she is too reactive at this time. Planned to change to hard wood from carpet prior to COVID. Tries to isolate cats in two rooms, but laundry room is one of them.   Armour thyroid recently decreased.  Current Outpatient Medications:  .  calcitRIOL (ROCALTROL) 0.25 MCG capsule, Take 0.25 mcg by mouth 2 (two) times daily. Alternating days of 2 capsules and 1 capsule., Disp: , Rfl:  .  Calcium Citrate-Vitamin D (CALCIUM CITRATE + D) 315-250 MG-UNIT TABS, Take by mouth 2 (two) times daily., Disp: , Rfl:  .  Cholecalciferol (VITAMIN D3) 1000 units CAPS, Take by mouth daily. , Disp: , Rfl:  .  hydrochlorothiazide (MICROZIDE) 12.5 MG capsule, Take 12.5 mg by mouth daily., Disp: , Rfl:  .  Magnesium 200 MG TABS, Take 2 tablets by mouth daily. , Disp: , Rfl:  .  PROAIR HFA 108 (90 Base) MCG/ACT inhaler, , Disp: , Rfl: 0 .  thyroid (ARMOUR) 90 MG tablet, Take 90 mg by mouth daily., Disp: , Rfl:   PMHx, SurgHx, SocialHx, Medications, and Allergies were reviewed in the Visit Navigator and updated as appropriate.   Past Medical History:  Diagnosis Date  . Akathisia   . Cancer [facility])    Thyroid Cancer 08/2006  . Endometriosis   . Hypocalcemia   . Hypoparathyroidism ([place])   . Hypothyroidism   . Kidney stones   . Latex allergy   . Thyroid disease    No thyroid or parathyroid, being followed by Dr Edmonia James at [facility] [phone])     Past Surgical History:  Procedure Laterality Date  . ABDOMINAL SURGERY    . CESAREAN SECTION  2001  . NM MYOCAR PERF WALL MOTION  07/09/2010   Protocol: Bruce, normal prefusion, no evidece of ischemia, exercise capacity 10METS  . OTHER SURGICAL  HISTORY     Laparoscopic surgery for endometriosis  . THYROIDECTOMY       Family History  Adopted: Yes  Problem Relation Age of Onset  . Food Allergy Daughter        Tree Nut Allergy    Social History   Tobacco Use  . Smoking status: Never Smoker  . Smokeless tobacco: Never Used  Substance Use Topics  . Alcohol use: No  . Drug use: No   Review of Systems:   Pertinent items are noted in the HPI. Otherwise, ROS is negative.  Objective:   BP 132/80 (BP Location: Left Arm, Patient Position: Sitting, Cuff Size: Normal)   Pulse 88   Ht 5\' 5"  (1.651 m)   Wt 170 lb (77.1 kg)   LMP 10/28/2018   SpO2 98%   BMI 28.29 kg/m   General appearance: alert, cooperative and appears stated age. Head: normocephalic, without obvious abnormality, atraumatic. Neck: no adenopathy, supple, symmetrical, trachea midline; thyroid not enlarged, symmetric, no tenderness/mass/nodules. Lungs: clear to auscultation bilaterally. Heart: regular rate and rhythm Abdomen: soft, non-tender; no masses,  no organomegaly. Extremities: extremities normal, atraumatic, no cyanosis or edema. Skin: skin color, texture, turgor normal, no rashes or lesions. Lymph: cervical, supraclavicular, and axillary nodes normal; no abnormal inguinal nodes palpated. Neurologic: grossly normal.  Assessment/Plan:   [name] was seen today for annual exam.  Diagnoses and all orders for this visit:  Routine physical examination  Postoperative hypothyroidism  Screening for lipid disorders -     Lipid panel  Malaise and fatigue -     CBC with Differential/Platelet   Patient Counseling: [x]    Nutrition: Stressed importance of moderation in sodium/caffeine intake, saturated fat and cholesterol, caloric balance, sufficient intake of fresh fruits, vegetables, fiber, calcium, iron, and 1 mg of folate supplement per day (for females capable of pregnancy).  [x]    Stressed the importance of  regular exercise.   [x]    Substance Abuse: Discussed cessation/primary prevention of tobacco, alcohol, or other drug use; driving or other dangerous activities under the influence; availability of treatment for abuse.   [x]    Injury prevention: Discussed safety belts, safety helmets, smoke detector, smoking near bedding or upholstery.   [x]    Sexuality: Discussed sexually transmitted diseases, partner selection, use of condoms, avoidance of unintended pregnancy  and contraceptive alternatives.  [x]    Dental health: Discussed importance of regular tooth brushing, flossing, and dental visits.  [x]    Health maintenance and immunizations reviewed. Please refer to Health maintenance section.   Briscoe Deutscher, DO [place]

## 2018-11-22 LAB — LIPID PANEL
Cholesterol: 170 mg/dL (ref 0–200)
HDL: 51 mg/dL (ref >39.00)
LDL Cholesterol: 95 mg/dL (ref 0–99)
NonHDL: 119.06
Total CHOL/HDL Ratio: 3
Triglycerides: 121 mg/dL (ref 0.0–149.0)
VLDL: 24.2 mg/dL (ref 0.0–40.0)

## 2018-11-22 LAB — CBC WITH DIFFERENTIAL/PLATELET
Basophils Absolute: 0 10*3/uL (ref 0.0–0.1)
Basophils Relative: 0.4 % (ref 0.0–3.0)
Eosinophils Absolute: 0.1 10*3/uL (ref 0.0–0.7)
Eosinophils Relative: 1.7 % (ref 0.0–5.0)
HCT: 41.3 % (ref 36.0–46.0)
Hemoglobin: 13.7 g/dL (ref 12.0–15.0)
Lymphocytes Relative: 19.4 % (ref 12.0–46.0)
Lymphs Abs: 1.6 10*3/uL (ref 0.7–4.0)
MCHC: 33.1 g/dL (ref 30.0–36.0)
MCV: 83.6 fl (ref 78.0–100.0)
Monocytes Absolute: 0.5 10*3/uL (ref 0.1–1.0)
Monocytes Relative: 6.2 % (ref 3.0–12.0)
Neutro Abs: 6 10*3/uL (ref 1.4–7.7)
Neutrophils Relative %: 72.3 % (ref 43.0–77.0)
Platelets: 285 10*3/uL (ref 150.0–400.0)
RBC: 4.94 Mil/uL (ref 3.87–5.11)
RDW: 14.5 % (ref 11.5–15.5)
WBC: 8.2 10*3/uL (ref 4.0–10.5)

## 2018-12-12 DIAGNOSIS — D8940 Mast cell activation, unspecified: Secondary | ICD-10-CM | POA: Diagnosis not present

## 2018-12-12 DIAGNOSIS — E039 Hypothyroidism, unspecified: Secondary | ICD-10-CM | POA: Diagnosis not present

## 2018-12-12 DIAGNOSIS — E559 Vitamin D deficiency, unspecified: Secondary | ICD-10-CM | POA: Diagnosis not present

## 2018-12-12 DIAGNOSIS — E892 Postprocedural hypoparathyroidism: Secondary | ICD-10-CM | POA: Diagnosis not present

## 2019-01-06 ENCOUNTER — Other Ambulatory Visit: Payer: Self-pay

## 2019-01-06 ENCOUNTER — Encounter: Payer: Self-pay | Admitting: Physician Assistant

## 2019-01-06 ENCOUNTER — Ambulatory Visit (INDEPENDENT_AMBULATORY_CARE_PROVIDER_SITE_OTHER): Payer: BC Managed Care – PPO | Admitting: Physician Assistant

## 2019-01-06 VITALS — BP 120/76 | HR 94 | Temp 98.3°F | Ht 65.0 in | Wt 167.0 lb

## 2019-01-06 DIAGNOSIS — R0981 Nasal congestion: Secondary | ICD-10-CM | POA: Diagnosis not present

## 2019-01-06 MED ORDER — AMOXICILLIN-POT CLAVULANATE 875-125 MG PO TABS: 1.0000 | ORAL_TABLET | Freq: Two times a day (BID) | ORAL | 0 refills | Status: DC

## 2019-01-06 NOTE — Progress Notes (Signed)
[name] is a 48 y.o. female here for a new problem.  I acted as a Education administrator for [employer], PA-C Anselmo Pickler, LPN  History of Present Illness:   Chief Complaint  Patient presents with  . Otalgia    HPI   Ear pain Pt c/o bilateral ear pain, R>L. She states that she has a history of TMJ but doesn't feel like she is clenching her teeth at night, but her jaw feels stiff in the morning like she has been clenching. At one point she had a whooshing sound in her ear but that has resolved. Also endorses R max sinus pressure. She has not tried anything for her symptoms.  Seeing Dr. Benjamine Mola next Wed for her jaw pain.  Denies: cough, chills, fever, sore throat   Past Medical History:  Diagnosis Date  . Akathisia   . Cancer [facility])    Thyroid Cancer 08/2006  . Endometriosis   . Hypocalcemia   . Hypoparathyroidism ([place])   . Hypothyroidism   . Kidney stones   . Latex allergy   . Thyroid disease    No thyroid or parathyroid, being followed by Dr Edmonia James at [facility] [phone])     Social History   Socioeconomic History  . Marital status: Married    Spouse name: Not on file  . Number of children: Not on file  . Years of education: Not on file  . Highest education level: Not on file  Occupational History  . Not on file  Social Needs  . Financial resource strain: Not on file  . Food insecurity    Worry: Not on file    Inability: Not on file  . Transportation needs    Medical: Not on file    Non-medical: Not on file  Tobacco Use  . Smoking status: Never Smoker  . Smokeless tobacco: Never Used  Substance and Sexual Activity  . Alcohol use: No  . Drug use: No  . Sexual activity: Yes    Partners: Male    Birth control/protection: Other-see comments    Comment: Husband had vasectomy  Lifestyle  . Physical activity    Days per week: Not on file    Minutes per session: Not on file  . Stress: Not on file  Relationships  . Social Herbalist on  phone: Not on file    Gets together: Not on file    Attends religious service: Not on file    Active member of club or organization: Not on file    Attends meetings of clubs or organizations: Not on file    Relationship status: Not on file  . Intimate partner violence    Fear of current or ex partner: Not on file    Emotionally abused: Not on file    Physically abused: Not on file    Forced sexual activity: Not on file  Other Topics Concern  . Not on file  Social History Narrative   Husband had a vasectomy.     Past Surgical History:  Procedure Laterality Date  . ABDOMINAL SURGERY    . CESAREAN SECTION  2001  . NM MYOCAR PERF WALL MOTION  07/09/2010   Protocol: Bruce, normal prefusion, no evidece of ischemia, exercise capacity 10METS  . OTHER SURGICAL HISTORY     Laparoscopic surgery for endometriosis  . THYROIDECTOMY      Family History  Adopted: Yes  Problem Relation Age of Onset  . Food Allergy Daughter  Tree Nut Allergy    Allergies  Allergen Reactions  . Compazine [Prochlorperazine Edisylate] Other (See Comments)    Heart rate   . Ciprofloxacin   . Codeine Itching  . Latex   . Metoclopramide Other (See Comments)    akathisia  . Sertraline Other (See Comments)    akathisia  . Benadryl [Diphenhydramine Hcl (Sleep)] Anxiety    Current Medications:   Current Outpatient Medications:  .  calcitRIOL (ROCALTROL) 0.25 MCG capsule, Take 0.25 mcg by mouth 2 (two) times daily. Alternating days of 2 capsules and 1 capsule., Disp: , Rfl:  .  Calcium Citrate-Vitamin D (CALCIUM CITRATE + D) 315-250 MG-UNIT TABS, Take by mouth 2 (two) times daily., Disp: , Rfl:  .  Cholecalciferol (VITAMIN D3) 1000 units CAPS, Take by mouth daily. , Disp: , Rfl:  .  hydrochlorothiazide (MICROZIDE) 12.5 MG capsule, Take 12.5 mg by mouth daily., Disp: , Rfl:  .  Magnesium 200 MG TABS, Take 2 tablets by mouth daily. , Disp: , Rfl:  .  PROAIR HFA 108 (90 Base) MCG/ACT inhaler, , Disp: ,  Rfl: 0 .  thyroid (ARMOUR) 90 MG tablet, Take 90 mg by mouth daily., Disp: , Rfl:  .  amoxicillin-clavulanate (AUGMENTIN) 875-125 MG tablet, Take 1 tablet by mouth 2 (two) times daily., Disp: 20 tablet, Rfl: 0   Review of Systems:   ROS Negative unless otherwise specified per HPI.  Vitals:   Vitals:   01/06/19 1508  BP: 120/76  Pulse: 94  Temp: 98.3 F (36.8 C)  TempSrc: Temporal  SpO2: 98%  Weight: 167 lb (75.8 kg)  Height: 5\' 5"  (1.651 m)     Body mass index is 27.79 kg/m.  Physical Exam:   Physical Exam Vitals signs and nursing note reviewed.  Constitutional:      General: She is not in acute distress.    Appearance: She is well-developed. She is not ill-appearing or toxic-appearing.  HENT:     Head: Normocephalic and atraumatic.     Right Ear: Ear canal and external ear normal. A middle ear effusion (clear fluid) is present. Tympanic membrane is not erythematous, retracted or bulging.     Left Ear: Tympanic membrane, ear canal and external ear normal. Tympanic membrane is not erythematous, retracted or bulging.     Nose:     Right Sinus: Frontal sinus tenderness present. No maxillary sinus tenderness.     Left Sinus: No maxillary sinus tenderness or frontal sinus tenderness.     Mouth/Throat:     Pharynx: Uvula midline. No posterior oropharyngeal erythema.  Eyes:     General: Lids are normal.     Conjunctiva/sclera: Conjunctivae normal.  Neck:     Trachea: Trachea normal.  Cardiovascular:     Rate and Rhythm: Normal rate and regular rhythm.     Heart sounds: Normal heart sounds, S1 normal and S2 normal.  Pulmonary:     Effort: Pulmonary effort is normal.     Breath sounds: Normal breath sounds. No decreased breath sounds, wheezing, rhonchi or rales.  Lymphadenopathy:     Cervical: No cervical adenopathy.  Skin:    General: Skin is warm and dry.  Neurological:     Mental Status: She is alert.  Psychiatric:        Speech: Speech normal.        Behavior:  Behavior normal. Behavior is cooperative.      Assessment and Plan:   [name] was seen today for otalgia.  Diagnoses and  all orders for this visit:  Sinus congestion  Other orders -     amoxicillin-clavulanate (AUGMENTIN) 875-125 MG tablet; Take 1 tablet by mouth 2 (two) times daily.   No red flags on exam.  Possibly at the beginning of a sinus infection. Recommended that she wait to start antibiotics until she has symptoms for more than 1 week, or if develops worsening symptoms. Discussed taking medications as prescribed if she does take them. Reviewed return precautions including worsening fever, SOB, worsening cough or other concerns. Push fluids and rest. I recommend that patient follow-up if symptoms worsen or persist despite treatment x 7-10 days, sooner if needed.  . Reviewed expectations re: course of current medical issues. . Discussed self-management of symptoms. . Outlined signs and symptoms indicating need for more acute intervention. . Patient verbalized understanding and all questions were answered. . See orders for this visit as documented in the electronic medical record. . Patient received an After-Visit Summary.  CMA or LPN served as scribe during this visit. History, Physical, and Plan performed by medical provider. The above documentation has been reviewed and is accurate and complete.   Inda Coke, PA-C

## 2019-01-06 NOTE — Patient Instructions (Signed)
It was great to see you!  You have a viral upper respiratory infection. Antibiotics are not needed for this.  Viral infections usually take 7-10 days to resolve.  The cough can last a few weeks to go away.  Use medication as prescribed if needed, as we discussed: augmentin  Push fluids and get plenty of rest. Please return if you are not improving as expected, or if you have high fevers (>101.5) or difficulty swallowing or worsening productive cough.  Call clinic with questions.  I hope you start feeling better soon!

## 2019-01-11 DIAGNOSIS — H6983 Other specified disorders of Eustachian tube, bilateral: Secondary | ICD-10-CM | POA: Diagnosis not present

## 2019-01-11 DIAGNOSIS — J343 Hypertrophy of nasal turbinates: Secondary | ICD-10-CM | POA: Diagnosis not present

## 2019-01-11 DIAGNOSIS — H9311 Tinnitus, right ear: Secondary | ICD-10-CM | POA: Diagnosis not present

## 2019-01-11 DIAGNOSIS — J310 Chronic rhinitis: Secondary | ICD-10-CM | POA: Diagnosis not present

## 2019-02-08 ENCOUNTER — Ambulatory Visit (INDEPENDENT_AMBULATORY_CARE_PROVIDER_SITE_OTHER): Payer: BC Managed Care – PPO | Admitting: Physician Assistant

## 2019-02-08 ENCOUNTER — Encounter: Payer: Self-pay | Admitting: Physician Assistant

## 2019-02-08 VITALS — Temp 98.5°F

## 2019-02-08 DIAGNOSIS — R0981 Nasal congestion: Secondary | ICD-10-CM

## 2019-02-08 DIAGNOSIS — F418 Other specified anxiety disorders: Secondary | ICD-10-CM

## 2019-02-08 MED ORDER — AMOXICILLIN-POT CLAVULANATE 875-125 MG PO TABS: 1.0000 | ORAL_TABLET | Freq: Two times a day (BID) | ORAL | 0 refills | Status: DC

## 2019-02-08 NOTE — Progress Notes (Signed)
Virtual Visit via Video   I connected with [name] on 02/08/19 at 11:20 AM EDT by a video enabled telemedicine application and verified that I am speaking with the correct person using two identifiers. Location patient: Home Location provider: [facility], Office Persons participating in the virtual visit: Satcha, Legleiter PA-C.  I discussed the limitations of evaluation and management by telemedicine and the availability of in person appointments. The patient expressed understanding and agreed to proceed.  I acted as a Education administrator for [employer], PA-C [employer], LPN  Subjective:   HPI:   Sinus problem Pt c/o bilateral ear pain, sinus headaches x 2 days, nasal congestion and post nasal drainage.  Reports that her upper teeth hurt. No fever or chills. Took Tylenol this morning with some relief and half Benadryl.  I saw her about a month ago and she saw Dr. Benjamine Mola soon after, who put a scope in her sinuses and said that she did not have any concerning findings.  But because she is having ongoing tinnitus, she is planning to have an MRI.  This has not been scheduled yet, but is currently undergoing approval from her insurance.  She has limited medications that she can tolerate, does not like to take ibuprofen due to history of kidney issues, and when she uses Flonase she has sensitivity including anxiety and increased hunger.  She cannot tolerate oral prednisone for the similar reactions, however she will if dire circumstances.  Situational anxiety She continues to deal with anxiety after her medical issues involving her parathyroid.  She feels like she may have some PTSD from undergoing all of her medical evaluation in the past.  She agrees that she has uncontrolled anxiety right now, however she is reluctant to start any medication.  She has not seen a therapist but is open to this.  She denies suicidal or homicidal thoughts.  Her husband is a good  support system for her.  ROS: See pertinent positives and negatives per HPI.  Patient Active Problem List   Diagnosis Date Noted   History of parathyroidectomy 07/11/2018   History of thyroidectomy 07/11/2018   History of urinary stone 07/11/2018   Moderate persistent asthma without complication [id number]   Other allergic rhinitis 04/11/2018   Urticaria, chronic 04/11/2018   Neck pain 01/20/2018   Postsurgical hypoparathyroidism ([place]) 02/18/2017   Postoperative hypothyroidism 02/18/2017   Papillary thyroid carcinoma ([place]) 05/09/2014   Hypercalciuria, type 1 08/14/2013    Social History   Tobacco Use   Smoking status: Never Smoker   Smokeless tobacco: Never Used  Substance Use Topics   Alcohol use: No    Current Outpatient Medications:    calcitRIOL (ROCALTROL) 0.25 MCG capsule, Take 0.25 mcg by mouth 2 (two) times daily. Alternating days of 2 capsules and 1 capsule., Disp: , Rfl:    Calcium Citrate-Vitamin D (CALCIUM CITRATE + D) 315-250 MG-UNIT TABS, Take by mouth 2 (two) times daily., Disp: , Rfl:    Cholecalciferol (VITAMIN D3) 1000 units CAPS, Take by mouth daily. , Disp: , Rfl:    hydrochlorothiazide (MICROZIDE) 12.5 MG capsule, Take 12.5 mg by mouth daily., Disp: , Rfl:    Magnesium 200 MG TABS, Take 2 tablets by mouth daily. , Disp: , Rfl:    PROAIR HFA 108 (90 Base) MCG/ACT inhaler, Inhale 1 puff into the lungs every 6 (six) hours as needed. , Disp: , Rfl: 0   thyroid (ARMOUR) 90 MG tablet, Take 90 mg by  mouth daily., Disp: , Rfl:    amoxicillin-clavulanate (AUGMENTIN) 875-125 MG tablet, Take 1 tablet by mouth 2 (two) times daily., Disp: 20 tablet, Rfl: 0  Allergies  Allergen Reactions   Compazine [Prochlorperazine Edisylate] Other (See Comments)    Heart rate    Ciprofloxacin    Codeine Itching   Latex    Metoclopramide Other (See Comments)    akathisia   Sertraline Other (See Comments)    akathisia   Benadryl [Diphenhydramine Hcl  (Sleep)] Anxiety    Objective:   VITALS: Per patient if applicable, see vitals. GENERAL: Alert, appears well and in no acute distress. HEENT: Atraumatic, conjunctiva clear, no obvious abnormalities on inspection of external nose and ears. NECK: Normal movements of the head and neck. CARDIOPULMONARY: No increased WOB. Speaking in clear sentences. I:E ratio WNL.  MS: Moves all visible extremities without noticeable abnormality. PSYCH: Pleasant and cooperative, well-groomed. Speech normal rate and rhythm. Affect is appropriate. Insight and judgement are appropriate. Attention is focused, linear, and appropriate.  NEURO: CN grossly intact. Oriented as arrived to appointment on time with no prompting. Moves both UE equally.  SKIN: No obvious lesions, wounds, erythema, or cyanosis noted on face or hands.  Assessment and Plan:   [name] was seen today for sinus problem.  Diagnoses and all orders for this visit:  Sinus congestion No red flags on discussion with patient.  Offered reassurance.  I do think she needs to follow-up with her allergist, which she plans to do so after her MRIs done.  I did recommend that she wait 7 to 10 days to start oral antibiotic --I did send in a pocket prescription of oral Augmentin.  I also recommended that if she feels comfortable, to take a small dose of Motrin to see if this helps with her pain --her last BMP in May was normal.  Also if able, recommended she try to do more consistent Flonase.  Discussed taking medications as prescribed. Reviewed return precautions including worsening fever, SOB, worsening cough or other concerns. Push fluids and rest. I recommend that patient follow-up if symptoms worsen or persist despite treatment x 7-10 days, sooner if needed.  Situational anxiety No red flags, however I did recommend that she consider therapy.  She is interested in seeing a Christian-based therapist, and will reach out to 1 if her husband is in agreement.  She  is very reluctant to start medications at this time.  I strongly encouraged that she pursue therapy.  Worsening precautions advised.  Other orders -     amoxicillin-clavulanate (AUGMENTIN) 875-125 MG tablet; Take 1 tablet by mouth 2 (two) times daily.     Reviewed expectations re: course of current medical issues.  Discussed self-management of symptoms.  Outlined signs and symptoms indicating need for more acute intervention.  Patient verbalized understanding and all questions were answered.  Health Maintenance issues including appropriate healthy diet, exercise, and smoking avoidance were discussed with patient.  See orders for this visit as documented in the electronic medical record.  I discussed the assessment and treatment plan with the patient. The patient was provided an opportunity to ask questions and all were answered. The patient agreed with the plan and demonstrated an understanding of the instructions.   The patient was advised to call back or seek an in-person evaluation if the symptoms worsen or if the condition fails to improve as anticipated.   CMA or LPN served as scribe during this visit. History, Physical, and Plan performed by medical provider.  The above documentation has been reviewed and is accurate and complete.   [place], [place] 02/08/2019

## 2019-02-10 ENCOUNTER — Other Ambulatory Visit: Payer: Self-pay | Admitting: Otolaryngology

## 2019-02-10 DIAGNOSIS — H9311 Tinnitus, right ear: Secondary | ICD-10-CM

## 2019-02-27 ENCOUNTER — Encounter: Payer: Self-pay | Admitting: Family Medicine

## 2019-03-06 ENCOUNTER — Other Ambulatory Visit: Payer: BC Managed Care – PPO

## 2019-03-06 ENCOUNTER — Ambulatory Visit
Admission: RE | Admit: 2019-03-06 | Discharge: 2019-03-06 | Disposition: A | Discharge status: Home / Self Care | Payer: BC Managed Care – PPO | Source: Ambulatory Visit | Attending: Otolaryngology | Admitting: Otolaryngology

## 2019-03-06 DIAGNOSIS — H93A1 Pulsatile tinnitus, right ear: Secondary | ICD-10-CM | POA: Diagnosis not present

## 2019-03-06 DIAGNOSIS — H9311 Tinnitus, right ear: Secondary | ICD-10-CM

## 2019-03-06 MED ORDER — GADOBENATE DIMEGLUMINE 529 MG/ML IV SOLN
15.0000 mL | Freq: Once | INTRAVENOUS | Status: AC | PRN
  Administered 2019-03-06: 15 mL via INTRAVENOUS

## 2019-03-22 DIAGNOSIS — H9311 Tinnitus, right ear: Secondary | ICD-10-CM | POA: Diagnosis not present

## 2019-03-24 ENCOUNTER — Other Ambulatory Visit: Payer: Self-pay

## 2019-03-24 ENCOUNTER — Encounter: Payer: Self-pay | Admitting: Family Medicine

## 2019-03-24 ENCOUNTER — Ambulatory Visit (INDEPENDENT_AMBULATORY_CARE_PROVIDER_SITE_OTHER): Payer: BC Managed Care – PPO | Admitting: Family Medicine

## 2019-03-24 VITALS — BP 104/64 | HR 81 | Temp 98.5°F | Ht 65.0 in | Wt 169.0 lb

## 2019-03-24 DIAGNOSIS — H9311 Tinnitus, right ear: Secondary | ICD-10-CM

## 2019-03-24 DIAGNOSIS — E890 Postprocedural hypothyroidism: Secondary | ICD-10-CM

## 2019-03-24 DIAGNOSIS — L700 Acne vulgaris: Secondary | ICD-10-CM

## 2019-03-24 DIAGNOSIS — Z23 Encounter for immunization: Secondary | ICD-10-CM | POA: Diagnosis not present

## 2019-03-24 LAB — COMPREHENSIVE METABOLIC PANEL
ALT: 15 U/L (ref 0–35)
AST: 12 U/L (ref 0–37)
Albumin: 4.3 g/dL (ref 3.5–5.2)
Alkaline Phosphatase: 79 U/L (ref 39–117)
BUN: 15 mg/dL (ref 6–23)
CO2: 26 mEq/L (ref 19–32)
Calcium: 8.8 mg/dL (ref 8.4–10.5)
Chloride: 102 mEq/L (ref 96–112)
Creatinine, Ser: 0.74 mg/dL (ref 0.40–1.20)
GFR: 83.81 mL/min (ref >60.00)
Glucose, Bld: 104 mg/dL — ABNORMAL HIGH (ref 70–99)
Potassium: 3.8 mEq/L (ref 3.5–5.1)
Sodium: 138 mEq/L (ref 135–145)
Total Bilirubin: 0.6 mg/dL (ref 0.2–1.2)
Total Protein: 6.8 g/dL (ref 6.0–8.3)

## 2019-03-24 LAB — T4, FREE: Free T4: 0.78 ng/dL (ref 0.60–1.60)

## 2019-03-24 LAB — TSH: TSH: 0.03 u[IU]/mL — ABNORMAL LOW (ref 0.35–4.50)

## 2019-03-24 LAB — VITAMIN D 25 HYDROXY (VIT D DEFICIENCY, FRACTURES): VITD: 43.48 ng/mL (ref 30.00–100.00)

## 2019-03-24 MED ORDER — EPIDUO FORTE 0.3-2.5 % EX GEL: 1.0000 "application " | Freq: Every day | CUTANEOUS | 1 refills | Status: DC

## 2019-03-24 NOTE — Progress Notes (Signed)
Patient: [name] MRN: [id number] DOB: Oct 21, 1970 PCP: Briscoe Deutscher, DO     Subjective:  Chief Complaint  Patient presents with  . R ear issue    HPI: The patient is a 48 y.o. female who presents today for ongoing issue with R ear.  She is able to hear her pulse in her R ear. She is already seeing an ENT (DrTeoh)    for this and has a MRI pending for this. im not sure what is going on with this, but they are going to rule out aneurysm. She is here today to have her thyroid checked as she read thyroid could cause tinnitus. Does not take any aspirin.   She also is getting acne on her face. She thinks this is due to hormones and is also  around where her mask is at. They are comodonal and hurt her.    Review of Systems  Constitutional: Negative for fatigue.  HENT: Positive for postnasal drip, sinus pressure, sinus pain and tinnitus. Negative for congestion, rhinorrhea and sore throat.        C/o being able to "hear her pulse in her R ear" pending waiting on MRI's to be done on her ear.  Eyes: Positive for visual disturbance.  Respiratory: Negative for shortness of breath.   Cardiovascular: Negative for chest pain, palpitations and leg swelling.  Skin:       Acne on chin,neck   Neurological: Positive for headaches. Negative for dizziness.  Psychiatric/Behavioral: Negative for sleep disturbance.    Allergies Patient is allergic to compazine [prochlorperazine edisylate]; ciprofloxacin; codeine; latex; metoclopramide; sertraline; and benadryl [diphenhydramine hcl (sleep)].  Past Medical History Patient  has a past medical history of Akathisia, Cancer ([place]), Endometriosis, Hypocalcemia, Hypoparathyroidism ([place]), Hypothyroidism, Kidney stones, Latex allergy, and Thyroid disease.  Surgical History Patient  has a past surgical history that includes Cesarean section (2001); Abdominal surgery; Thyroidectomy; NM MYOCAR PERF WALL MOTION (07/09/2010); and OTHER SURGICAL  HISTORY.  Family History Pateint's family history includes Food Allergy in her daughter. She was adopted.  Social History Patient  reports that she has never smoked. She has never used smokeless tobacco. She reports that she does not drink alcohol or use drugs.    Objective: Vitals:   03/24/19 1009  BP: 104/64  Pulse: 81  Temp: 98.5 F (36.9 C)  TempSrc: Skin  SpO2: 98%  Weight: 169 lb (76.7 kg)  Height: 5\' 5"  (1.651 m)    Body mass index is 28.12 kg/m.  Physical Exam Vitals signs reviewed.  Constitutional:      Appearance: Normal appearance.  HENT:     Head: Normocephalic and atraumatic.     Right Ear: Tympanic membrane, ear canal and external ear normal.     Left Ear: Tympanic membrane, ear canal and external ear normal.     Nose: Nose normal.     Mouth/Throat:     Mouth: Mucous membranes are moist.  Eyes:     Extraocular Movements: Extraocular movements intact.     Pupils: Pupils are equal, round, and reactive to light.  Neck:     Musculoskeletal: Normal range of motion and neck supple. No neck rigidity or muscular tenderness.     Vascular: No carotid bruit.  Cardiovascular:     Rate and Rhythm: Normal rate and regular rhythm.     Heart sounds: Normal heart sounds.  Pulmonary:     Effort: Pulmonary effort is normal.     Breath sounds: Normal breath sounds.  Abdominal:  General: Abdomen is flat. Bowel sounds are normal.     Palpations: Abdomen is soft.  Lymphadenopathy:     Cervical: No cervical adenopathy.  Skin:    Comments: comedone acne on chin, jawline and just inferior to this bilaterally   Neurological:     General: No focal deficit present.     Mental Status: She is alert and oriented to person, place, and time.        Assessment/plan: 1. Postoperative hypothyroidism Will check labs on her as requested. Followed by endo at [facility].  - TSH - T4, free - Comprehensive metabolic panel  2. Tinnitus of right ear Seeing ent and has a scan  pending to r/o aneurysm. Aneurysm seems low on differential.   3. comodonal acne -likely secondary to mask could be hormonal as well. Trial of epiduo. Discussed drying effects, sun protection and can stain dark sheets. May need to try every other day if drying effect. Remove mask as much as possible at home.     Return if symptoms worsen or fail to improve.  Orma Flaming, MD [place]   03/24/2019

## 2019-03-28 ENCOUNTER — Other Ambulatory Visit: Payer: Self-pay | Admitting: Otolaryngology

## 2019-03-28 ENCOUNTER — Encounter: Payer: Self-pay | Admitting: Family Medicine

## 2019-03-28 DIAGNOSIS — H9311 Tinnitus, right ear: Secondary | ICD-10-CM

## 2019-03-29 ENCOUNTER — Other Ambulatory Visit: Payer: Self-pay

## 2019-03-29 MED ORDER — EPIDUO FORTE 0.3-2.5 % EX GEL: 1.0000 "application " | Freq: Every day | CUTANEOUS | 1 refills | Status: DC

## 2019-03-31 ENCOUNTER — Encounter: Payer: Self-pay | Admitting: Family Medicine

## 2019-04-18 ENCOUNTER — Other Ambulatory Visit: Payer: Self-pay

## 2019-04-18 ENCOUNTER — Ambulatory Visit
Admission: RE | Admit: 2019-04-18 | Discharge: 2019-04-18 | Disposition: A | Discharge status: Home / Self Care | Payer: BC Managed Care – PPO | Source: Ambulatory Visit | Attending: Otolaryngology | Admitting: Otolaryngology

## 2019-04-18 DIAGNOSIS — H93A1 Pulsatile tinnitus, right ear: Secondary | ICD-10-CM | POA: Diagnosis not present

## 2019-04-18 DIAGNOSIS — H9311 Tinnitus, right ear: Secondary | ICD-10-CM

## 2019-04-19 ENCOUNTER — Ambulatory Visit
Admission: RE | Admit: 2019-04-19 | Discharge: 2019-04-19 | Disposition: A | Discharge status: Home / Self Care | Payer: BC Managed Care – PPO | Source: Ambulatory Visit | Attending: Otolaryngology | Admitting: Otolaryngology

## 2019-04-19 DIAGNOSIS — H93A1 Pulsatile tinnitus, right ear: Secondary | ICD-10-CM | POA: Diagnosis not present

## 2019-04-19 DIAGNOSIS — H9311 Tinnitus, right ear: Secondary | ICD-10-CM

## 2019-06-05 ENCOUNTER — Other Ambulatory Visit: Payer: Self-pay | Admitting: Family Medicine

## 2019-06-05 DIAGNOSIS — Z1231 Encounter for screening mammogram for malignant neoplasm of breast: Secondary | ICD-10-CM

## 2019-06-30 DIAGNOSIS — H93A9 Pulsatile tinnitus, unspecified ear: Secondary | ICD-10-CM | POA: Diagnosis not present

## 2019-06-30 DIAGNOSIS — F419 Anxiety disorder, unspecified: Secondary | ICD-10-CM | POA: Diagnosis not present

## 2019-07-18 ENCOUNTER — Other Ambulatory Visit: Payer: Self-pay

## 2019-07-18 ENCOUNTER — Ambulatory Visit
Admission: RE | Admit: 2019-07-18 | Discharge: 2019-07-18 | Disposition: A | Discharge status: Home / Self Care | Payer: BC Managed Care – PPO | Source: Ambulatory Visit | Attending: Family Medicine | Admitting: Family Medicine

## 2019-07-18 DIAGNOSIS — Z1231 Encounter for screening mammogram for malignant neoplasm of breast: Secondary | ICD-10-CM | POA: Diagnosis not present

## 2019-08-11 DIAGNOSIS — E890 Postprocedural hypothyroidism: Secondary | ICD-10-CM | POA: Diagnosis not present

## 2019-08-11 DIAGNOSIS — E208 Other hypoparathyroidism: Secondary | ICD-10-CM | POA: Diagnosis not present

## 2019-08-11 DIAGNOSIS — Z85850 Personal history of malignant neoplasm of thyroid: Secondary | ICD-10-CM | POA: Diagnosis not present

## 2019-08-11 DIAGNOSIS — G2581 Restless legs syndrome: Secondary | ICD-10-CM | POA: Diagnosis not present

## 2019-08-17 ENCOUNTER — Ambulatory Visit: Payer: BC Managed Care – PPO | Attending: Internal Medicine

## 2019-08-17 DIAGNOSIS — Z23 Encounter for immunization: Secondary | ICD-10-CM

## 2019-08-17 NOTE — Progress Notes (Signed)
   Covid-19 Vaccination Clinic  Name:  [name]    MRN: [id number] DOB: [birth year]  08/17/2019  [name] was observed post Covid-19 immunization for 15 minutes without incident. She was provided with Vaccine Information Sheet and instruction to access the V-Safe system.   [name] was instructed to call 911 with any severe reactions post vaccine: Marland Kitchen Difficulty breathing  . Swelling of face and throat  . A fast heartbeat  . A bad rash all over body  . Dizziness and weakness   Immunizations Administered    Name Date Dose VIS Date Route   Pfizer COVID-19 Vaccine 08/17/2019 12:01 PM 0.3 mL 05/12/2019 Intramuscular   Manufacturer: [place]   Lot: [id number]   [place]: [id number]

## 2019-08-22 DIAGNOSIS — L538 Other specified erythematous conditions: Secondary | ICD-10-CM | POA: Diagnosis not present

## 2019-08-22 DIAGNOSIS — Z23 Encounter for immunization: Secondary | ICD-10-CM | POA: Diagnosis not present

## 2019-08-23 DIAGNOSIS — F4322 Adjustment disorder with anxiety: Secondary | ICD-10-CM | POA: Diagnosis not present

## 2019-08-31 DIAGNOSIS — F4322 Adjustment disorder with anxiety: Secondary | ICD-10-CM | POA: Diagnosis not present

## 2019-09-04 ENCOUNTER — Encounter: Payer: Self-pay | Admitting: *Deleted

## 2019-09-05 ENCOUNTER — Other Ambulatory Visit: Payer: Self-pay

## 2019-09-05 ENCOUNTER — Encounter: Payer: Self-pay | Admitting: Diagnostic Neuroimaging

## 2019-09-05 ENCOUNTER — Ambulatory Visit: Payer: BC Managed Care – PPO | Admitting: Diagnostic Neuroimaging

## 2019-09-05 VITALS — BP 132/70 | HR 108 | Temp 98.1°F | Ht 65.0 in | Wt 168.0 lb

## 2019-09-05 DIAGNOSIS — J3081 Allergic rhinitis due to animal (cat) (dog) hair and dander: Secondary | ICD-10-CM | POA: Diagnosis not present

## 2019-09-05 DIAGNOSIS — R448 Other symptoms and signs involving general sensations and perceptions: Secondary | ICD-10-CM

## 2019-09-05 DIAGNOSIS — M79641 Pain in right hand: Secondary | ICD-10-CM

## 2019-09-05 DIAGNOSIS — R0602 Shortness of breath: Secondary | ICD-10-CM | POA: Diagnosis not present

## 2019-09-05 DIAGNOSIS — M79642 Pain in left hand: Secondary | ICD-10-CM

## 2019-09-05 DIAGNOSIS — R05 Cough: Secondary | ICD-10-CM | POA: Diagnosis not present

## 2019-09-05 DIAGNOSIS — L508 Other urticaria: Secondary | ICD-10-CM | POA: Diagnosis not present

## 2019-09-05 DIAGNOSIS — R6889 Other general symptoms and signs: Secondary | ICD-10-CM | POA: Diagnosis not present

## 2019-09-05 DIAGNOSIS — J309 Allergic rhinitis, unspecified: Secondary | ICD-10-CM | POA: Diagnosis not present

## 2019-09-05 DIAGNOSIS — M2550 Pain in unspecified joint: Secondary | ICD-10-CM | POA: Diagnosis not present

## 2019-09-05 NOTE — Progress Notes (Signed)
[facility]  PATIENT: [name] DOB: Apr 27, 1971  REFERRING CLINICIAN: Clelland, Delrae Rend, PA HISTORY FROM: patient  REASON FOR VISIT: new consult    HISTORICAL  CHIEF COMPLAINT:  Chief Complaint  Patient presents with  . Pain    rm 7 New Pt , husband- Suezanne Jacquet  "had Covid vaccine #1 3/17; now hands/feet get swollen, hot, sweaty and sting; my head, lips, ears also; my skin is chapping; I feel hot all over"    HISTORY OF PRESENT ILLNESS:   49 year old female here for evaluation of abnormal sensations.  3/18/1 patient received first dose of Covid vaccine [employer]) and immediately felt abnormal sensation on the top of her head.  Over the next few days she felt hot sensation, swelling sensation, pain sensations throughout her body.  She feels a dry and chapped sensation on her skin.  Patient reports prior history of sensitive autoimmune system and medication sensitivity.  Patient also has anxiety, fibromyalgia issues, managed by chiropractor in the past.    REVIEW OF SYSTEMS: Full 14 system review of systems performed and negative with exception of: As per HPI.  ALLERGIES: Allergies  Allergen Reactions  . Compazine [Prochlorperazine Edisylate] Other (See Comments)    High Heart rate   . Ciprofloxacin   . Codeine Itching  . Latex   . Metoclopramide Other (See Comments)    akathisia  . Sertraline Other (See Comments)    akathisia  . Benadryl [Diphenhydramine Hcl (Sleep)] Anxiety    HOME MEDICATIONS: Outpatient Medications Prior to Visit  Medication Sig Dispense Refill  . Ascorbic Acid (VITAMIN C) 500 MG CHEW     . calcitRIOL (ROCALTROL) 0.25 MCG capsule Take 0.25 mcg by mouth 2 (two) times daily. Alternating days of 2 capsules and 1 capsule.    . Calcium Citrate-Vitamin D (CALCIUM CITRATE + D) 315-250 MG-UNIT TABS Take by mouth 2 (two) times daily.    . Cholecalciferol (VITAMIN D3) 1000 units CAPS Take by mouth daily.     . fluticasone (FLONASE  SENSIMIST) 27.5 MCG/SPRAY nasal spray     . hydrochlorothiazide (HYDRODIURIL) 12.5 MG tablet     . Magnesium 200 MG TABS Take 2 tablets by mouth daily.     Marland Kitchen PROAIR HFA 108 (90 Base) MCG/ACT inhaler Inhale 1 puff into the lungs every 6 (six) hours as needed.   0  . thyroid (ARMOUR) 90 MG tablet Take 90 mg by mouth daily.    . Adapalene-Benzoyl Peroxide (EPIDUO FORTE) 0.3-2.5 % GEL Apply 1 application topically daily. 60 g 1  . hydrochlorothiazide (MICROZIDE) 12.5 MG capsule Take 12.5 mg by mouth daily.     No facility-administered medications prior to visit.    PAST MEDICAL HISTORY: Past Medical History:  Diagnosis Date  . Akathisia   . Atypical nevus 06/14/2001   left abdomen (slight)  . Atypical nevus 12/01/2011   left arm/inner (severe) exc 01/21/12  . Cancer [facility])    Thyroid Cancer 08/2006  . Endometriosis   . Hypocalcemia   . Hypoparathyroidism ([place])   . Hypothyroidism   . Kidney stones   . Latex allergy   . Migraines    age 65's  . Polycystic ovary syndrome   . Thyroid disease    No thyroid or parathyroid, being followed by Dr Edmonia James at [facility] [phone])  . Vertigo     PAST SURGICAL HISTORY: Past Surgical History:  Procedure Laterality Date  . ABDOMINAL SURGERY  2008  . CESAREAN SECTION  2001  .  NM MYOCAR PERF WALL MOTION  07/09/2010   Protocol: Bruce, normal prefusion, no evidece of ischemia, exercise capacity 10METS  . OTHER SURGICAL HISTORY  1995   Laparoscopic surgery for endometriosis  . THYROIDECTOMY  2008    FAMILY HISTORY: Family History  Adopted: Yes  Problem Relation Age of Onset  . Food Allergy Daughter        Tree Nut Allergy    SOCIAL HISTORY: Social History   Socioeconomic History  . Marital status: Married    Spouse name: Marland Kitchen  . Number of children: 3  . Years of education: Not on file  . Highest education level: Bachelor's degree (e.g., BA, AB, BS)  Occupational History  . Not on file  Tobacco Use  . Smoking status:  Never Smoker  . Smokeless tobacco: Never Used  Substance and Sexual Activity  . Alcohol use: No  . Drug use: No  . Sexual activity: Yes    Partners: Male    Birth control/protection: Other-see comments    Comment: Husband had vasectomy  Other Topics Concern  . Not on file  Social History Narrative   Lives with family   Husband had a vasectomy.    Caffeine- 1 cup   Social Determinants of Health   Financial Resource Strain:   . Difficulty of Paying Living Expenses:   Food Insecurity:   . Worried About Charity fundraiser in the Last Year:   . Arboriculturist in the Last Year:   Transportation Needs:   . Film/video editor (Medical):   Marland Kitchen Lack of Transportation (Non-Medical):   Physical Activity:   . Days of Exercise per Week:   . Minutes of Exercise per Session:   Stress:   . Feeling of Stress :   Social Connections:   . Frequency of Communication with Friends and Family:   . Frequency of Social Gatherings with Friends and Family:   . Attends Religious Services:   . Active Member of Clubs or Organizations:   . Attends Archivist Meetings:   Marland Kitchen Marital Status:   Intimate Partner Violence:   . Fear of Current or Ex-Partner:   . Emotionally Abused:   Marland Kitchen Physically Abused:   . Sexually Abused:      PHYSICAL EXAM  GENERAL EXAM/CONSTITUTIONAL: Vitals:  Vitals:   09/05/19 1537  BP: 132/70  Pulse: (!) 108  Temp: 98.1 F (36.7 C)  Weight: 168 lb (76.2 kg)  Height: 5\' 5"  (1.651 m)     Body mass index is 27.96 kg/m. Wt Readings from Last 3 Encounters:  09/05/19 168 lb (76.2 kg)  03/24/19 169 lb (76.7 kg)  01/06/19 167 lb (75.8 kg)     Patient is in no distress; well developed, nourished and groomed; neck is supple  CARDIOVASCULAR:  Examination of carotid arteries is normal; no carotid bruits  Regular rate and rhythm, no murmurs  Examination of peripheral vascular system by observation and palpation is normal  EYES:  Ophthalmoscopic exam  of optic discs and posterior segments is normal; no papilledema or hemorrhages  No exam data present  MUSCULOSKELETAL:  Gait, strength, tone, movements noted in Neurologic exam below  NEUROLOGIC: MENTAL STATUS:  No flowsheet data found.  awake, alert, oriented to person, place and time  recent and remote memory intact  normal attention and concentration  language fluent, comprehension intact, naming intact  fund of knowledge appropriate  CRANIAL NERVE:   2nd - no papilledema on fundoscopic exam  2nd, 3rd,  4th, 6th - pupils equal and reactive to light, visual fields full to confrontation, extraocular muscles intact, no nystagmus  5th - facial sensation symmetric  7th - facial strength symmetric  8th - hearing intact  9th - palate elevates symmetrically, uvula midline  11th - shoulder shrug symmetric  12th - tongue protrusion midline  MOTOR:   normal bulk and tone, full strength in the BUE, BLE  SENSORY:   normal and symmetric to light touch, temperature, vibration  COORDINATION:   finger-nose-finger, fine finger movements normal  REFLEXES:   deep tendon reflexes present and symmetric  GAIT/STATION:   narrow based gait     DIAGNOSTIC DATA (LABS, IMAGING, TESTING) - I reviewed patient records, labs, notes, testing and imaging myself where available.  Lab Results  Component Value Date   WBC 8.2 11/21/2018   HGB 13.7 11/21/2018   HCT 41.3 11/21/2018   MCV 83.6 11/21/2018   PLT 285.0 11/21/2018      Component Value Date/Time   NA 138 03/24/2019 1047   NA 142 10/07/2018 0000   K 3.8 03/24/2019 1047   CL 102 03/24/2019 1047   CO2 26 03/24/2019 1047   GLUCOSE 104 (H) 03/24/2019 1047   BUN 15 03/24/2019 1047   BUN 11 10/07/2018 0000   CREATININE 0.74 03/24/2019 1047   CALCIUM 8.8 03/24/2019 1047   PROT 6.8 03/24/2019 1047   ALBUMIN 4.3 03/24/2019 1047   AST 12 03/24/2019 1047   ALT 15 03/24/2019 1047   ALKPHOS 79 03/24/2019 1047    BILITOT 0.6 03/24/2019 1047   GFRNONAA 54 (L) 03/02/2017 2129   GFRAA >60 03/02/2017 2129   Lab Results  Component Value Date   CHOL 170 11/21/2018   HDL 51.00 11/21/2018   LDLCALC 95 11/21/2018   TRIG 121.0 11/21/2018   CHOLHDL 3 11/21/2018   No results found for: HGBA1C No results found for: VITAMINB12 Lab Results  Component Value Date   TSH 0.03 (L) 03/24/2019      ASSESSMENT AND PLAN  49 y.o. year old female here with abnormal sensations following first dose of Covid vaccine on 08/17/2019.  Neurologic examination is unremarkable.  Patient has heightened anxiety state.  Dx:  1. Sensation of both heat and cold   2. Pain in both hands     PLAN:  POST COVID VACCINE PAIN / SUBJECTIVE SWELLING / HEAT SENSATIONS - unclear etiology; no definite primary neurologic issues; continue to monitor symptoms; follow up with PCP and allergy / immunology  Return for pending if symptoms worsen or fail to improve, return to PCP.    Penni Bombard, MD [id number], [id number] PM Certified in Neurology, Neurophysiology and Neuroimaging  [facility] [address] [place], [place] [address] [phone]

## 2019-09-07 DIAGNOSIS — F4322 Adjustment disorder with anxiety: Secondary | ICD-10-CM | POA: Diagnosis not present

## 2019-09-11 ENCOUNTER — Ambulatory Visit: Payer: BC Managed Care – PPO

## 2019-09-13 DIAGNOSIS — F4322 Adjustment disorder with anxiety: Secondary | ICD-10-CM | POA: Diagnosis not present

## 2019-09-15 DIAGNOSIS — Z6827 Body mass index (BMI) 27.0-27.9, adult: Secondary | ICD-10-CM | POA: Diagnosis not present

## 2019-09-15 DIAGNOSIS — Z01419 Encounter for gynecological examination (general) (routine) without abnormal findings: Secondary | ICD-10-CM | POA: Diagnosis not present

## 2019-09-15 DIAGNOSIS — R309 Painful micturition, unspecified: Secondary | ICD-10-CM | POA: Diagnosis not present

## 2019-09-19 DIAGNOSIS — F4321 Adjustment disorder with depressed mood: Secondary | ICD-10-CM | POA: Diagnosis not present

## 2019-09-26 ENCOUNTER — Ambulatory Visit: Payer: BC Managed Care – PPO

## 2019-09-26 DIAGNOSIS — F4322 Adjustment disorder with anxiety: Secondary | ICD-10-CM | POA: Diagnosis not present

## 2019-10-03 DIAGNOSIS — F4322 Adjustment disorder with anxiety: Secondary | ICD-10-CM | POA: Diagnosis not present

## 2019-10-17 DIAGNOSIS — F4322 Adjustment disorder with anxiety: Secondary | ICD-10-CM | POA: Diagnosis not present

## 2019-10-19 DIAGNOSIS — R21 Rash and other nonspecific skin eruption: Secondary | ICD-10-CM | POA: Diagnosis not present

## 2019-10-19 DIAGNOSIS — M791 Myalgia, unspecified site: Secondary | ICD-10-CM | POA: Diagnosis not present

## 2019-10-19 DIAGNOSIS — D8989 Other specified disorders involving the immune mechanism, not elsewhere classified: Secondary | ICD-10-CM | POA: Diagnosis not present

## 2019-10-19 DIAGNOSIS — E063 Autoimmune thyroiditis: Secondary | ICD-10-CM | POA: Diagnosis not present

## 2019-10-19 DIAGNOSIS — R7989 Other specified abnormal findings of blood chemistry: Secondary | ICD-10-CM | POA: Diagnosis not present

## 2019-10-19 DIAGNOSIS — R231 Pallor: Secondary | ICD-10-CM | POA: Diagnosis not present

## 2019-10-19 DIAGNOSIS — M3501 Sicca syndrome with keratoconjunctivitis: Secondary | ICD-10-CM | POA: Diagnosis not present

## 2019-10-24 DIAGNOSIS — F4322 Adjustment disorder with anxiety: Secondary | ICD-10-CM | POA: Diagnosis not present

## 2019-10-25 DIAGNOSIS — C73 Malignant neoplasm of thyroid gland: Secondary | ICD-10-CM | POA: Diagnosis not present

## 2019-10-25 DIAGNOSIS — R82994 Hypercalciuria: Secondary | ICD-10-CM | POA: Diagnosis not present

## 2019-10-25 DIAGNOSIS — E892 Postprocedural hypoparathyroidism: Secondary | ICD-10-CM | POA: Diagnosis not present

## 2019-10-25 DIAGNOSIS — R34 Anuria and oliguria: Secondary | ICD-10-CM | POA: Diagnosis not present

## 2019-10-25 DIAGNOSIS — N200 Calculus of kidney: Secondary | ICD-10-CM | POA: Diagnosis not present

## 2019-10-31 DIAGNOSIS — F4322 Adjustment disorder with anxiety: Secondary | ICD-10-CM | POA: Diagnosis not present

## 2019-11-07 DIAGNOSIS — F4322 Adjustment disorder with anxiety: Secondary | ICD-10-CM | POA: Diagnosis not present

## 2019-11-13 DIAGNOSIS — F4322 Adjustment disorder with anxiety: Secondary | ICD-10-CM | POA: Diagnosis not present

## 2019-11-21 DIAGNOSIS — F4322 Adjustment disorder with anxiety: Secondary | ICD-10-CM | POA: Diagnosis not present

## 2019-11-24 ENCOUNTER — Encounter: Payer: BC Managed Care – PPO | Admitting: Physician Assistant

## 2019-11-24 ENCOUNTER — Encounter: Payer: BC Managed Care – PPO | Admitting: Family Medicine

## 2019-11-24 DIAGNOSIS — R109 Unspecified abdominal pain: Secondary | ICD-10-CM | POA: Diagnosis not present

## 2019-11-24 DIAGNOSIS — R310 Gross hematuria: Secondary | ICD-10-CM | POA: Diagnosis not present

## 2019-11-24 DIAGNOSIS — R3911 Hesitancy of micturition: Secondary | ICD-10-CM | POA: Diagnosis not present

## 2019-11-28 DIAGNOSIS — F4322 Adjustment disorder with anxiety: Secondary | ICD-10-CM | POA: Diagnosis not present

## 2019-11-30 DIAGNOSIS — E559 Vitamin D deficiency, unspecified: Secondary | ICD-10-CM | POA: Diagnosis not present

## 2019-11-30 DIAGNOSIS — R002 Palpitations: Secondary | ICD-10-CM | POA: Diagnosis not present

## 2019-11-30 DIAGNOSIS — E890 Postprocedural hypothyroidism: Secondary | ICD-10-CM | POA: Diagnosis not present

## 2019-11-30 DIAGNOSIS — E892 Postprocedural hypoparathyroidism: Secondary | ICD-10-CM | POA: Diagnosis not present

## 2019-12-06 ENCOUNTER — Other Ambulatory Visit: Payer: Self-pay | Admitting: Physician Assistant

## 2019-12-06 DIAGNOSIS — E892 Postprocedural hypoparathyroidism: Secondary | ICD-10-CM

## 2019-12-12 DIAGNOSIS — F4322 Adjustment disorder with anxiety: Secondary | ICD-10-CM | POA: Diagnosis not present

## 2019-12-19 DIAGNOSIS — F4322 Adjustment disorder with anxiety: Secondary | ICD-10-CM | POA: Diagnosis not present

## 2019-12-26 DIAGNOSIS — F4322 Adjustment disorder with anxiety: Secondary | ICD-10-CM | POA: Diagnosis not present

## 2020-03-01 ENCOUNTER — Other Ambulatory Visit: Payer: BC Managed Care – PPO

## 2020-06-10 ENCOUNTER — Ambulatory Visit
Admission: RE | Admit: 2020-06-10 | Discharge: 2020-06-10 | Disposition: A | Discharge status: Home / Self Care | Payer: BC Managed Care – PPO | Source: Ambulatory Visit | Attending: Physician Assistant | Admitting: Physician Assistant

## 2020-06-10 ENCOUNTER — Other Ambulatory Visit: Payer: Self-pay | Admitting: Physician Assistant

## 2020-06-10 DIAGNOSIS — M25512 Pain in left shoulder: Secondary | ICD-10-CM

## 2020-07-22 ENCOUNTER — Other Ambulatory Visit: Payer: Self-pay | Admitting: Physician Assistant

## 2020-07-22 DIAGNOSIS — Z1231 Encounter for screening mammogram for malignant neoplasm of breast: Secondary | ICD-10-CM

## 2020-09-12 ENCOUNTER — Ambulatory Visit
Admission: RE | Admit: 2020-09-12 | Discharge: 2020-09-12 | Disposition: A | Discharge status: Home / Self Care | Payer: BC Managed Care – PPO | Source: Ambulatory Visit | Attending: Physician Assistant | Admitting: Physician Assistant

## 2020-09-12 ENCOUNTER — Other Ambulatory Visit: Payer: Self-pay

## 2020-09-12 DIAGNOSIS — Z1231 Encounter for screening mammogram for malignant neoplasm of breast: Secondary | ICD-10-CM
